# Patient Record
Sex: Female | Born: 1945 | Race: White | Hispanic: No | Smoking: Former smoker
Health system: Southern US, Community
[De-identification: ages and names within clinical notes are randomized; demographics above are authoritative.]

## PROBLEM LIST (undated history)

## (undated) DIAGNOSIS — K219 Gastro-esophageal reflux disease without esophagitis: Secondary | ICD-10-CM

## (undated) DIAGNOSIS — R319 Hematuria, unspecified: Secondary | ICD-10-CM

## (undated) DIAGNOSIS — I10 Essential (primary) hypertension: Secondary | ICD-10-CM

## (undated) DIAGNOSIS — M1990 Unspecified osteoarthritis, unspecified site: Secondary | ICD-10-CM

## (undated) DIAGNOSIS — Z87442 Personal history of urinary calculi: Secondary | ICD-10-CM

## (undated) DIAGNOSIS — C801 Malignant (primary) neoplasm, unspecified: Secondary | ICD-10-CM

## (undated) DIAGNOSIS — R002 Palpitations: Secondary | ICD-10-CM

## (undated) HISTORY — DX: Palpitations: R00.2

## (undated) HISTORY — PX: COLON RESECTION: SHX5231

## (undated) HISTORY — PX: TUBAL LIGATION: SHX77

## (undated) HISTORY — DX: Essential (primary) hypertension: I10

## (undated) HISTORY — DX: Hematuria, unspecified: R31.9

---

## 2001-01-20 ENCOUNTER — Encounter: Payer: Self-pay | Admitting: Orthopedic Surgery

## 2001-01-20 ENCOUNTER — Encounter: Payer: Self-pay | Admitting: Obstetrics and Gynecology

## 2001-01-20 ENCOUNTER — Encounter: Admission: RE | Admit: 2001-01-20 | Discharge: 2001-01-20 | Payer: Self-pay | Admitting: Orthopedic Surgery

## 2001-01-26 ENCOUNTER — Encounter: Admission: RE | Admit: 2001-01-26 | Discharge: 2001-01-26 | Payer: Self-pay | Admitting: Obstetrics and Gynecology

## 2001-01-26 ENCOUNTER — Encounter: Payer: Self-pay | Admitting: Obstetrics and Gynecology

## 2005-11-04 ENCOUNTER — Other Ambulatory Visit: Admission: RE | Admit: 2005-11-04 | Discharge: 2005-11-04 | Payer: Self-pay | Admitting: Obstetrics and Gynecology

## 2005-12-03 ENCOUNTER — Encounter: Admission: RE | Admit: 2005-12-03 | Discharge: 2005-12-03 | Payer: Self-pay | Admitting: Obstetrics and Gynecology

## 2015-04-13 DIAGNOSIS — Z23 Encounter for immunization: Secondary | ICD-10-CM | POA: Diagnosis not present

## 2015-04-13 DIAGNOSIS — E559 Vitamin D deficiency, unspecified: Secondary | ICD-10-CM | POA: Diagnosis not present

## 2015-04-13 DIAGNOSIS — M179 Osteoarthritis of knee, unspecified: Secondary | ICD-10-CM | POA: Diagnosis not present

## 2015-04-13 DIAGNOSIS — Z1211 Encounter for screening for malignant neoplasm of colon: Secondary | ICD-10-CM | POA: Diagnosis not present

## 2015-04-13 DIAGNOSIS — Z0000 Encounter for general adult medical examination without abnormal findings: Secondary | ICD-10-CM | POA: Diagnosis not present

## 2015-04-13 DIAGNOSIS — I10 Essential (primary) hypertension: Secondary | ICD-10-CM | POA: Diagnosis not present

## 2015-06-01 DIAGNOSIS — G4700 Insomnia, unspecified: Secondary | ICD-10-CM | POA: Diagnosis not present

## 2015-06-01 DIAGNOSIS — Z136 Encounter for screening for cardiovascular disorders: Secondary | ICD-10-CM | POA: Diagnosis not present

## 2015-06-01 DIAGNOSIS — E785 Hyperlipidemia, unspecified: Secondary | ICD-10-CM | POA: Diagnosis not present

## 2015-06-01 DIAGNOSIS — K219 Gastro-esophageal reflux disease without esophagitis: Secondary | ICD-10-CM | POA: Diagnosis not present

## 2015-06-01 DIAGNOSIS — I10 Essential (primary) hypertension: Secondary | ICD-10-CM | POA: Diagnosis not present

## 2015-06-01 DIAGNOSIS — E782 Mixed hyperlipidemia: Secondary | ICD-10-CM | POA: Diagnosis not present

## 2016-03-24 DIAGNOSIS — I10 Essential (primary) hypertension: Secondary | ICD-10-CM | POA: Diagnosis not present

## 2016-03-24 DIAGNOSIS — R197 Diarrhea, unspecified: Secondary | ICD-10-CM | POA: Diagnosis not present

## 2016-03-24 DIAGNOSIS — Z23 Encounter for immunization: Secondary | ICD-10-CM | POA: Diagnosis not present

## 2016-03-24 DIAGNOSIS — Z0000 Encounter for general adult medical examination without abnormal findings: Secondary | ICD-10-CM | POA: Diagnosis not present

## 2016-03-25 DIAGNOSIS — R197 Diarrhea, unspecified: Secondary | ICD-10-CM | POA: Diagnosis not present

## 2016-04-03 DIAGNOSIS — R197 Diarrhea, unspecified: Secondary | ICD-10-CM | POA: Diagnosis not present

## 2016-04-03 DIAGNOSIS — M179 Osteoarthritis of knee, unspecified: Secondary | ICD-10-CM | POA: Diagnosis not present

## 2016-04-03 DIAGNOSIS — R739 Hyperglycemia, unspecified: Secondary | ICD-10-CM | POA: Diagnosis not present

## 2016-05-26 DIAGNOSIS — Z23 Encounter for immunization: Secondary | ICD-10-CM | POA: Diagnosis not present

## 2016-05-26 DIAGNOSIS — R197 Diarrhea, unspecified: Secondary | ICD-10-CM | POA: Diagnosis not present

## 2016-05-26 DIAGNOSIS — I10 Essential (primary) hypertension: Secondary | ICD-10-CM | POA: Diagnosis not present

## 2016-05-29 DIAGNOSIS — R197 Diarrhea, unspecified: Secondary | ICD-10-CM | POA: Diagnosis not present

## 2016-06-06 DIAGNOSIS — Z23 Encounter for immunization: Secondary | ICD-10-CM | POA: Diagnosis not present

## 2016-06-06 DIAGNOSIS — R197 Diarrhea, unspecified: Secondary | ICD-10-CM | POA: Diagnosis not present

## 2016-06-06 DIAGNOSIS — I10 Essential (primary) hypertension: Secondary | ICD-10-CM | POA: Diagnosis not present

## 2016-06-06 DIAGNOSIS — Z0000 Encounter for general adult medical examination without abnormal findings: Secondary | ICD-10-CM | POA: Diagnosis not present

## 2016-06-06 DIAGNOSIS — Z01419 Encounter for gynecological examination (general) (routine) without abnormal findings: Secondary | ICD-10-CM | POA: Diagnosis not present

## 2016-06-06 DIAGNOSIS — Z1211 Encounter for screening for malignant neoplasm of colon: Secondary | ICD-10-CM | POA: Diagnosis not present

## 2016-06-06 DIAGNOSIS — Z6834 Body mass index (BMI) 34.0-34.9, adult: Secondary | ICD-10-CM | POA: Diagnosis not present

## 2016-06-20 DIAGNOSIS — R197 Diarrhea, unspecified: Secondary | ICD-10-CM | POA: Diagnosis not present

## 2016-06-20 DIAGNOSIS — Z6834 Body mass index (BMI) 34.0-34.9, adult: Secondary | ICD-10-CM | POA: Diagnosis not present

## 2016-07-25 DIAGNOSIS — R197 Diarrhea, unspecified: Secondary | ICD-10-CM | POA: Diagnosis not present

## 2016-10-06 DIAGNOSIS — K591 Functional diarrhea: Secondary | ICD-10-CM | POA: Diagnosis not present

## 2016-10-07 DIAGNOSIS — R197 Diarrhea, unspecified: Secondary | ICD-10-CM | POA: Diagnosis not present

## 2016-10-09 DIAGNOSIS — R197 Diarrhea, unspecified: Secondary | ICD-10-CM | POA: Diagnosis not present

## 2016-11-18 DIAGNOSIS — A079 Protozoal intestinal disease, unspecified: Secondary | ICD-10-CM | POA: Diagnosis not present

## 2016-12-05 DIAGNOSIS — A09 Infectious gastroenteritis and colitis, unspecified: Secondary | ICD-10-CM | POA: Diagnosis not present

## 2016-12-18 DIAGNOSIS — I10 Essential (primary) hypertension: Secondary | ICD-10-CM | POA: Diagnosis not present

## 2016-12-18 DIAGNOSIS — L820 Inflamed seborrheic keratosis: Secondary | ICD-10-CM | POA: Diagnosis not present

## 2016-12-18 DIAGNOSIS — R197 Diarrhea, unspecified: Secondary | ICD-10-CM | POA: Diagnosis not present

## 2016-12-29 DIAGNOSIS — Z1211 Encounter for screening for malignant neoplasm of colon: Secondary | ICD-10-CM | POA: Diagnosis not present

## 2016-12-29 DIAGNOSIS — K5790 Diverticulosis of intestine, part unspecified, without perforation or abscess without bleeding: Secondary | ICD-10-CM | POA: Diagnosis not present

## 2016-12-29 DIAGNOSIS — K5730 Diverticulosis of large intestine without perforation or abscess without bleeding: Secondary | ICD-10-CM | POA: Diagnosis not present

## 2016-12-29 DIAGNOSIS — D128 Benign neoplasm of rectum: Secondary | ICD-10-CM | POA: Diagnosis not present

## 2017-01-01 DIAGNOSIS — L820 Inflamed seborrheic keratosis: Secondary | ICD-10-CM | POA: Diagnosis not present

## 2017-01-01 DIAGNOSIS — L918 Other hypertrophic disorders of the skin: Secondary | ICD-10-CM | POA: Diagnosis not present

## 2017-01-01 DIAGNOSIS — F411 Generalized anxiety disorder: Secondary | ICD-10-CM | POA: Diagnosis not present

## 2017-01-01 DIAGNOSIS — C189 Malignant neoplasm of colon, unspecified: Secondary | ICD-10-CM | POA: Diagnosis not present

## 2017-01-01 DIAGNOSIS — Z6834 Body mass index (BMI) 34.0-34.9, adult: Secondary | ICD-10-CM | POA: Diagnosis not present

## 2017-01-02 DIAGNOSIS — D128 Benign neoplasm of rectum: Secondary | ICD-10-CM | POA: Diagnosis not present

## 2017-01-23 DIAGNOSIS — K621 Rectal polyp: Secondary | ICD-10-CM | POA: Diagnosis not present

## 2017-01-29 DIAGNOSIS — M179 Osteoarthritis of knee, unspecified: Secondary | ICD-10-CM | POA: Diagnosis not present

## 2017-01-29 DIAGNOSIS — Z6834 Body mass index (BMI) 34.0-34.9, adult: Secondary | ICD-10-CM | POA: Diagnosis not present

## 2017-01-29 DIAGNOSIS — I10 Essential (primary) hypertension: Secondary | ICD-10-CM | POA: Diagnosis not present

## 2017-01-29 DIAGNOSIS — C189 Malignant neoplasm of colon, unspecified: Secondary | ICD-10-CM | POA: Diagnosis not present

## 2017-03-11 DIAGNOSIS — I10 Essential (primary) hypertension: Secondary | ICD-10-CM | POA: Diagnosis not present

## 2017-03-11 DIAGNOSIS — K621 Rectal polyp: Secondary | ICD-10-CM | POA: Diagnosis not present

## 2017-03-11 DIAGNOSIS — Z01818 Encounter for other preprocedural examination: Secondary | ICD-10-CM | POA: Diagnosis not present

## 2017-03-11 DIAGNOSIS — K6282 Dysplasia of anus: Secondary | ICD-10-CM | POA: Diagnosis not present

## 2017-03-11 DIAGNOSIS — K629 Disease of anus and rectum, unspecified: Secondary | ICD-10-CM | POA: Diagnosis not present

## 2017-03-12 DIAGNOSIS — R9431 Abnormal electrocardiogram [ECG] [EKG]: Secondary | ICD-10-CM | POA: Diagnosis not present

## 2017-03-12 DIAGNOSIS — R000 Tachycardia, unspecified: Secondary | ICD-10-CM | POA: Diagnosis not present

## 2017-03-23 DIAGNOSIS — K6282 Dysplasia of anus: Secondary | ICD-10-CM | POA: Diagnosis not present

## 2017-03-23 DIAGNOSIS — D128 Benign neoplasm of rectum: Secondary | ICD-10-CM | POA: Diagnosis not present

## 2017-03-23 DIAGNOSIS — I10 Essential (primary) hypertension: Secondary | ICD-10-CM | POA: Diagnosis not present

## 2017-03-23 DIAGNOSIS — K621 Rectal polyp: Secondary | ICD-10-CM | POA: Diagnosis not present

## 2017-04-15 DIAGNOSIS — K621 Rectal polyp: Secondary | ICD-10-CM | POA: Diagnosis not present

## 2017-04-15 DIAGNOSIS — Z9889 Other specified postprocedural states: Secondary | ICD-10-CM | POA: Diagnosis not present

## 2017-04-15 DIAGNOSIS — Z48815 Encounter for surgical aftercare following surgery on the digestive system: Secondary | ICD-10-CM | POA: Diagnosis not present

## 2017-04-15 DIAGNOSIS — Z8719 Personal history of other diseases of the digestive system: Secondary | ICD-10-CM | POA: Diagnosis not present

## 2017-05-28 DIAGNOSIS — H04123 Dry eye syndrome of bilateral lacrimal glands: Secondary | ICD-10-CM | POA: Diagnosis not present

## 2017-05-28 DIAGNOSIS — H2513 Age-related nuclear cataract, bilateral: Secondary | ICD-10-CM | POA: Diagnosis not present

## 2017-06-11 ENCOUNTER — Other Ambulatory Visit: Payer: Self-pay | Admitting: Family Medicine

## 2017-06-11 ENCOUNTER — Ambulatory Visit
Admission: RE | Admit: 2017-06-11 | Discharge: 2017-06-11 | Disposition: A | Discharge status: Home / Self Care | Payer: Medicare Other | Source: Ambulatory Visit | Attending: Family Medicine | Admitting: Family Medicine

## 2017-06-11 DIAGNOSIS — Z23 Encounter for immunization: Secondary | ICD-10-CM | POA: Diagnosis not present

## 2017-06-11 DIAGNOSIS — C189 Malignant neoplasm of colon, unspecified: Secondary | ICD-10-CM | POA: Diagnosis not present

## 2017-06-11 DIAGNOSIS — K219 Gastro-esophageal reflux disease without esophagitis: Secondary | ICD-10-CM | POA: Diagnosis not present

## 2017-06-11 DIAGNOSIS — I10 Essential (primary) hypertension: Secondary | ICD-10-CM | POA: Diagnosis not present

## 2017-06-11 DIAGNOSIS — M170 Bilateral primary osteoarthritis of knee: Secondary | ICD-10-CM

## 2017-06-11 DIAGNOSIS — Z1231 Encounter for screening mammogram for malignant neoplasm of breast: Secondary | ICD-10-CM

## 2017-06-11 DIAGNOSIS — Z0000 Encounter for general adult medical examination without abnormal findings: Secondary | ICD-10-CM | POA: Diagnosis not present

## 2017-06-11 DIAGNOSIS — Z6834 Body mass index (BMI) 34.0-34.9, adult: Secondary | ICD-10-CM | POA: Diagnosis not present

## 2017-06-11 DIAGNOSIS — M179 Osteoarthritis of knee, unspecified: Secondary | ICD-10-CM | POA: Diagnosis not present

## 2017-06-11 DIAGNOSIS — Z79899 Other long term (current) drug therapy: Secondary | ICD-10-CM | POA: Diagnosis not present

## 2017-06-29 ENCOUNTER — Ambulatory Visit
Admission: RE | Admit: 2017-06-29 | Discharge: 2017-06-29 | Disposition: A | Discharge status: Home / Self Care | Payer: Medicare Other | Source: Ambulatory Visit | Attending: Family Medicine | Admitting: Family Medicine

## 2017-06-29 DIAGNOSIS — Z1231 Encounter for screening mammogram for malignant neoplasm of breast: Secondary | ICD-10-CM | POA: Diagnosis not present

## 2017-06-30 ENCOUNTER — Ambulatory Visit: Payer: Self-pay

## 2017-07-23 DIAGNOSIS — Z6835 Body mass index (BMI) 35.0-35.9, adult: Secondary | ICD-10-CM | POA: Diagnosis not present

## 2017-07-23 DIAGNOSIS — M170 Bilateral primary osteoarthritis of knee: Secondary | ICD-10-CM | POA: Diagnosis not present

## 2017-07-23 DIAGNOSIS — K219 Gastro-esophageal reflux disease without esophagitis: Secondary | ICD-10-CM | POA: Diagnosis not present

## 2017-07-23 DIAGNOSIS — E782 Mixed hyperlipidemia: Secondary | ICD-10-CM | POA: Diagnosis not present

## 2017-07-23 DIAGNOSIS — G4700 Insomnia, unspecified: Secondary | ICD-10-CM | POA: Diagnosis not present

## 2017-07-30 DIAGNOSIS — M170 Bilateral primary osteoarthritis of knee: Secondary | ICD-10-CM | POA: Diagnosis not present

## 2017-08-07 DIAGNOSIS — M170 Bilateral primary osteoarthritis of knee: Secondary | ICD-10-CM | POA: Diagnosis not present

## 2017-08-13 DIAGNOSIS — M170 Bilateral primary osteoarthritis of knee: Secondary | ICD-10-CM | POA: Diagnosis not present

## 2017-08-20 DIAGNOSIS — M170 Bilateral primary osteoarthritis of knee: Secondary | ICD-10-CM | POA: Diagnosis not present

## 2017-08-24 DIAGNOSIS — M170 Bilateral primary osteoarthritis of knee: Secondary | ICD-10-CM | POA: Diagnosis not present

## 2017-10-08 DIAGNOSIS — M19019 Primary osteoarthritis, unspecified shoulder: Secondary | ICD-10-CM | POA: Diagnosis not present

## 2017-10-08 DIAGNOSIS — R05 Cough: Secondary | ICD-10-CM | POA: Diagnosis not present

## 2017-10-08 DIAGNOSIS — Z6835 Body mass index (BMI) 35.0-35.9, adult: Secondary | ICD-10-CM | POA: Diagnosis not present

## 2017-10-08 DIAGNOSIS — M170 Bilateral primary osteoarthritis of knee: Secondary | ICD-10-CM | POA: Diagnosis not present

## 2017-11-13 DIAGNOSIS — C20 Malignant neoplasm of rectum: Secondary | ICD-10-CM | POA: Insufficient documentation

## 2018-01-29 DIAGNOSIS — M170 Bilateral primary osteoarthritis of knee: Secondary | ICD-10-CM | POA: Diagnosis not present

## 2018-02-03 DIAGNOSIS — M170 Bilateral primary osteoarthritis of knee: Secondary | ICD-10-CM | POA: Diagnosis not present

## 2018-02-26 DIAGNOSIS — M170 Bilateral primary osteoarthritis of knee: Secondary | ICD-10-CM | POA: Diagnosis not present

## 2018-03-05 DIAGNOSIS — M170 Bilateral primary osteoarthritis of knee: Secondary | ICD-10-CM | POA: Diagnosis not present

## 2018-03-15 ENCOUNTER — Telehealth: Payer: Self-pay | Admitting: Oncology

## 2018-03-15 ENCOUNTER — Encounter: Payer: Self-pay | Admitting: Oncology

## 2018-03-15 DIAGNOSIS — C20 Malignant neoplasm of rectum: Secondary | ICD-10-CM | POA: Diagnosis not present

## 2018-03-15 NOTE — Telephone Encounter (Signed)
New transfer of care from [facility] for rectal cancer. Pt has been scheduled to see Dr. Benay Spice on 8/1 at 2pm. Pt has agreed to the appt date and time. Letter mailed.

## 2018-03-15 NOTE — Progress Notes (Signed)
Patient called to request appointment with Dr. Benay Spice. Patient had surgery on 4/19 at [facility] for rectal cancer. Patient would like to transfer care to [facility] because it is closer to her home. Patient scheduled to see Dr. Benay Spice on 03/25/18 @ 2 PM. Patient aware to arrive 30 minutes early.

## 2018-03-25 ENCOUNTER — Telehealth: Payer: Self-pay

## 2018-03-25 ENCOUNTER — Encounter: Payer: Self-pay | Admitting: Oncology

## 2018-03-25 ENCOUNTER — Inpatient Hospital Stay: Payer: Medicare PPO | Attending: Oncology | Admitting: Oncology

## 2018-03-25 VITALS — BP 140/95 | HR 105 | Temp 97.6°F | Resp 18 | Ht 60.05 in | Wt 176.4 lb

## 2018-03-25 DIAGNOSIS — E049 Nontoxic goiter, unspecified: Secondary | ICD-10-CM

## 2018-03-25 DIAGNOSIS — Z8049 Family history of malignant neoplasm of other genital organs: Secondary | ICD-10-CM | POA: Diagnosis not present

## 2018-03-25 DIAGNOSIS — C20 Malignant neoplasm of rectum: Secondary | ICD-10-CM

## 2018-03-25 DIAGNOSIS — Z8051 Family history of malignant neoplasm of kidney: Secondary | ICD-10-CM

## 2018-03-25 DIAGNOSIS — Z87891 Personal history of nicotine dependence: Secondary | ICD-10-CM

## 2018-03-25 DIAGNOSIS — I10 Essential (primary) hypertension: Secondary | ICD-10-CM | POA: Diagnosis not present

## 2018-03-25 NOTE — Progress Notes (Signed)
[facility] Patient Consult   Requesting MD: Fanny Bien, Md [address] [place], [place] [address]   SAMA ARAUZ 72 y.o.  [birth year]    Reason for consult: Colorectal cancer   HPI: Ms. Schooley reports initially developing "diarrhea "approximately 2 years ago.  She was referred to Dr. Earlean Shawl and underwent a colonoscopy 12/29/2016.  A malignant appearing polyp was partially resected at 15 cm.  The pathology revealed high-grade dysplasia and a tubulovillous adenoma.  She was referred to Dr. Drue Flirt and colorectal surgery at [facility].  She was taken to the operating room for a laparoscopic assisted polypectomy on 03/23/2017.  A snare polypectomy was performed at the polyp site and biopsies of the base were obtained.  The site was tattooed.  The pathology revealed superficial fragments of a tubular adenoma with high-grade dysplasia.  The biopsy from the rectal polyp bed revealed dysplastic glandular epithelium involving fibrotic stroma.  Close observation was elected as opposed to proceeding with a rectal resection. She reports recurrence of diarrhea in early 2019.  She saw Dr. Earlean Shawl and a sigmoidoscopy revealed adenocarcinoma at the previous polypectomy site (we do not have the sigmoidoscopy report available today). She was referred back to Dr. Drue Flirt and underwent a laparoscopic anterior resection of the rectum on 12/10/2017.  Tattoo was noted above the peritoneal reflection with a small mucosal-based lesion at that level.  No evidence of metastatic disease.  The pathology revealed a moderately differentiated adenocarcinoma measuring 1.2 cm.  Carcinoma focally extended beyond the muscularis propria.  23 lymph nodes were negative.  The resection margins were negative.  No lymphovascular or perineural invasion.  No tumor deposits.  No loss of mismatch repair protein expression.  She has recovered from surgery.  She saw Dr. Shary Decamp on 03/15/2018 to  discuss adjuvant treatment options.  She decided to transfer her medical oncology care to [facility].  A preoperative MRI of the pelvis 12/04/2017 revealed the tumor to be 12 cm from the anal verge.  The tumor was noted to be entirely above the peritoneal reflection.  The tumor was staged as a T2 versus early T3 lesion no suspicious lymph nodes.  The tumor was staged as a T2 (equivocal T3), N0 high rectal mass above the peritoneal reflection.  CTs of the chest, abdomen, and pelvis on 12/04/2017 revealed no evidence of metastatic disease.  A small subpleural node and scattered micronodular subpleural lesions were noted.  A left thyroid goiter was noted.   Past Medical History:  Diagnosis Date  . Hematuria    (W/u  neg 2011)  . Hypertension   . Palpitation     .  G4, P3, 1 miscarriage   .  GERD  Past Surgical History:  Procedure Laterality Date  . TUBAL LIGATION      Medications: Reviewed  Allergies:  Allergies  Allergen Reactions  . Lisinopril     cough    Family history: Her half sister had endometrial cancer.  Her half brother had renal cell cancer.  No other family history of cancer.  Social History:   She lives alone in [place].  She is retired from the [employer].  She quit smoking cigarettes 6 years ago.  No alcohol use.  No transfusion history.  No risk factor for HIV or hepatitis.  ROS:   Positives include: Reflux symptoms, bilateral knee arthritis  A complete ROS was otherwise negative.  Physical Exam:  Blood pressure (!) 140/95, pulse Marland Kitchen)  105, temperature 97.6 F (36.4 C), temperature source Oral, resp. rate 18, weight 176 lb 6.4 oz (80 kg), SpO2 100 %.  HEENT: Oropharynx without visible mass, without mass Lungs: End inspiratory rales at the right upper posterior chest, no respiratory distress Cardiac: Regular rate and rhythm Abdomen: No hepatosplenomegaly, no mass, nontender  Vascular: No leg edema Lymph nodes: No cervical, supraclavicular, axillary,  or inguinal nodes Neurologic: Alert and oriented, the motor exam appears intact in the upper and lower extremities Skin: No rash Musculoskeletal: Spine tenderness   LAB:  CBC  No results found for: WBC, HGB, HCT, MCV, PLT, NEUTROABS      CMP  No results found for: NA, K, CL, CO2, GLUCOSE, BUN, CREATININE, CALCIUM, PROT, ALBUMIN, AST, ALT, ALKPHOS, BILITOT, GFRNONAA, GFRAA   CEA on 01/07/2018: 0.7  Imaging:  As per HPI, CT and MRI images not available for review today   Assessment/Plan:   1. 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 on colonoscopy 12/30/2016  Laparoscopic assisted polypectomy 03/24/2017- superficial fragments of a tubular adenoma with high-grade dysplasia, biopsy of the polyp base-dysplastic glandular epithelium involving fibrotic stroma  Recurrent diarrhea 2019  Sigmoidoscopy-invasive moderately differentiated adenocarcinoma at the previous polyp site  CTs chest, abdomen, pelvis on 12/04/2017-no evidence of metastatic disease, left thyroid goiter, small subpleural node  MRI pelvis 12/04/2017, T2 (equivocal T3), N0 high rectal mass above the peritoneal reflection, measured at 12 cm from the anal verge  Laparoscopic anterior resection 12/10/2017, stage II (pT3,pN0 close (moderately differentiated adenocarcinoma of the rectum, early T3, no lymphovascular or perineural invasion, no loss of mismatch repair protein expression 2. Hypertension  3.   History of tobacco use  4.   Left thyroid "goiter "noted on CT 12/04/2017   Disposition:   Ms. Zylka was diagnosed with high rectal cancer when she underwent a laparoscopic anterior resection 12/10/2017.  The tumor is located in the high rectum, above the peritoneal reflection.  The early T3 tumor has no associated high risk features.  Ms. Essex has a good prognosis for a long-term disease-free survival.  She is now greater than 3 months out from surgery.  I do not recommend adjuvant  systemic chemotherapy.  I doubt radiation will be recommended, but I will present her case at the GI tumor conference to discuss the indication for adjuvant radiation.  She does not appear to have hereditary non-polyposis colon cancer syndrome, but her family members are increased risk of developing colorectal cancer and should receive appropriate screening.  She will return for an office visit and CEA in approximately 3 months.  She has a history of tobacco use.  She will be referred for a screening chest CT in 2020.  Ms. Tibbetts should follow-up with Dr. Earlean Shawl for surveillance colonoscopies.    Betsy Coder, MD  03/25/2018, 2:12 PM

## 2018-03-25 NOTE — Telephone Encounter (Signed)
Printed avs and calender of upcoming appointment. Per 8/1 los 

## 2018-04-09 ENCOUNTER — Telehealth: Payer: Self-pay

## 2018-04-09 NOTE — Telephone Encounter (Signed)
Received call from pt stating she is returning a call. No record of outgoing call documented. Pt informed of next appts. Pt will see if voicemail was left. This RN voiced understanding

## 2018-04-30 DIAGNOSIS — C20 Malignant neoplasm of rectum: Secondary | ICD-10-CM | POA: Diagnosis not present

## 2018-05-31 DIAGNOSIS — R197 Diarrhea, unspecified: Secondary | ICD-10-CM | POA: Diagnosis not present

## 2018-06-10 DIAGNOSIS — A09 Infectious gastroenteritis and colitis, unspecified: Secondary | ICD-10-CM | POA: Diagnosis not present

## 2018-06-14 DIAGNOSIS — A09 Infectious gastroenteritis and colitis, unspecified: Secondary | ICD-10-CM | POA: Diagnosis not present

## 2018-06-14 DIAGNOSIS — Z85048 Personal history of other malignant neoplasm of rectum, rectosigmoid junction, and anus: Secondary | ICD-10-CM | POA: Diagnosis not present

## 2018-06-18 DIAGNOSIS — Z6834 Body mass index (BMI) 34.0-34.9, adult: Secondary | ICD-10-CM | POA: Diagnosis not present

## 2018-06-18 DIAGNOSIS — Z0000 Encounter for general adult medical examination without abnormal findings: Secondary | ICD-10-CM | POA: Diagnosis not present

## 2018-06-18 DIAGNOSIS — M170 Bilateral primary osteoarthritis of knee: Secondary | ICD-10-CM | POA: Diagnosis not present

## 2018-06-18 DIAGNOSIS — F411 Generalized anxiety disorder: Secondary | ICD-10-CM | POA: Diagnosis not present

## 2018-06-18 DIAGNOSIS — Z23 Encounter for immunization: Secondary | ICD-10-CM | POA: Diagnosis not present

## 2018-06-18 DIAGNOSIS — C189 Malignant neoplasm of colon, unspecified: Secondary | ICD-10-CM | POA: Diagnosis not present

## 2018-06-25 ENCOUNTER — Inpatient Hospital Stay (HOSPITAL_BASED_OUTPATIENT_CLINIC_OR_DEPARTMENT_OTHER): Payer: Medicare PPO | Admitting: Oncology

## 2018-06-25 ENCOUNTER — Inpatient Hospital Stay: Payer: Medicare PPO | Attending: Oncology

## 2018-06-25 ENCOUNTER — Telehealth: Payer: Self-pay | Admitting: *Deleted

## 2018-06-25 ENCOUNTER — Telehealth: Payer: Self-pay | Admitting: Oncology

## 2018-06-25 VITALS — BP 150/90 | HR 96 | Temp 97.5°F | Resp 20 | Ht 60.05 in | Wt 178.2 lb

## 2018-06-25 DIAGNOSIS — C20 Malignant neoplasm of rectum: Secondary | ICD-10-CM

## 2018-06-25 DIAGNOSIS — I10 Essential (primary) hypertension: Secondary | ICD-10-CM | POA: Diagnosis not present

## 2018-06-25 DIAGNOSIS — C19 Malignant neoplasm of rectosigmoid junction: Secondary | ICD-10-CM | POA: Diagnosis not present

## 2018-06-25 DIAGNOSIS — Z87891 Personal history of nicotine dependence: Secondary | ICD-10-CM | POA: Insufficient documentation

## 2018-06-25 LAB — CEA (IN HOUSE-CHCC): CEA (CHCC-In House): 1 ng/mL (ref 0.00–5.00)

## 2018-06-25 NOTE — Telephone Encounter (Signed)
Patient notified of norma CEA.

## 2018-06-25 NOTE — Telephone Encounter (Signed)
-----   Message from Ladell Pier, MD sent at 06/25/2018  2:42 PM EDT ----- Please call patient, cea is normal

## 2018-06-25 NOTE — Telephone Encounter (Signed)
Appts scheduled avs/calendar printed per 11/1 los °

## 2018-06-25 NOTE — Progress Notes (Addendum)
  [place] OFFICE PROGRESS NOTE   Diagnosis: Colorectal cancer  INTERVAL HISTORY:   Ms. Stillion returns as scheduled.  She has irregular bowel habits.  She reports being treated for "E. coli "Dr. Earlean Shawl.  She has loose stool approximately every 3 days. She has an upper respiratory infection at present.  She reports sinus drainage and a cough for the past month.  No fever or dyspnea. Good appetite.  Objective:  Vital signs in last 24 hours:  Blood pressure (!) 150/90, pulse 96, temperature (!) 97.5 F (36.4 C), temperature source Oral, resp. rate 20, height 5' 0.05" (1.525 m), weight 178 lb 3.2 oz (80.8 kg), SpO2 99 %.    HEENT: Neck without mass Lymphatics: No cervical, supraclavicular, axillary, or inguinal nodes Resp: Scattered bilateral inspiratory/expiratory wheeze and rhonchi at the upper anterior and posterior chest, no respiratory distress Cardio: Regular rate and rhythm GI: Nontender, no hepatomegaly, no mass Vascular: No leg edema     Medications: I have reviewed the patient's current medications.   Assessment/Plan: 1. Rectal cancer ? 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 Laparoscopic assisted polypectomy 03/24/2017- superficial fragments of a tubular adenoma with high-grade dysplasia, biopsy of the polyp base-dysplastic glandular epithelium involving fibrotic stroma ? Recurrent diarrhea 2019 ? Sigmoidoscopy-invasive moderately differentiated adenocarcinoma at the previous polyp site ? CTs chest, abdomen, pelvis on 12/04/2017-no evidence of metastatic disease, left thyroid goiter, small subpleural node ? MRI pelvis 12/04/2017, T2 (equivocal T3), N0 high rectal mass above the peritoneal reflection, measured at 12 cm from the anal verge ? Laparoscopic anterior resection 12/10/2017, stage II (pT3,pN0 close (moderately differentiated adenocarcinoma of the rectum, early T3, no lymphovascular or perineural invasion,  no loss of mismatch repair protein expression ? CEA on 01/07/2018 - 0.7 2. Hypertension  3.   History of tobacco use  4.   Left thyroid "goiter "noted on CT 12/04/2017   Disposition: Ms. Grassel is in clinical remission from colon cancer.  We will follow-up on the CEA from today.  She will return for an office visit and CEA in 6 months. She will continue follow-up with Dr. Earlean Shawl for management of the symptoms stool infection and irregular bowel habits.  She will see Dr. Ernie Hew if the upper respiratory symptoms do not improve.  15 minutes were spent with the patient today.  The majority of the time was used for counseling and coordination of care.  Betsy Coder, MD  06/25/2018  11:31 AM

## 2018-07-05 DIAGNOSIS — R05 Cough: Secondary | ICD-10-CM | POA: Diagnosis not present

## 2018-07-05 DIAGNOSIS — Z6834 Body mass index (BMI) 34.0-34.9, adult: Secondary | ICD-10-CM | POA: Diagnosis not present

## 2018-07-05 DIAGNOSIS — J309 Allergic rhinitis, unspecified: Secondary | ICD-10-CM | POA: Diagnosis not present

## 2018-07-30 DIAGNOSIS — J209 Acute bronchitis, unspecified: Secondary | ICD-10-CM | POA: Diagnosis not present

## 2018-07-30 DIAGNOSIS — Z6834 Body mass index (BMI) 34.0-34.9, adult: Secondary | ICD-10-CM | POA: Diagnosis not present

## 2018-07-30 DIAGNOSIS — J309 Allergic rhinitis, unspecified: Secondary | ICD-10-CM | POA: Diagnosis not present

## 2018-07-30 DIAGNOSIS — M170 Bilateral primary osteoarthritis of knee: Secondary | ICD-10-CM | POA: Diagnosis not present

## 2018-07-30 DIAGNOSIS — G4700 Insomnia, unspecified: Secondary | ICD-10-CM | POA: Diagnosis not present

## 2018-07-30 DIAGNOSIS — F411 Generalized anxiety disorder: Secondary | ICD-10-CM | POA: Diagnosis not present

## 2018-08-30 DIAGNOSIS — J209 Acute bronchitis, unspecified: Secondary | ICD-10-CM | POA: Diagnosis not present

## 2018-08-30 DIAGNOSIS — C189 Malignant neoplasm of colon, unspecified: Secondary | ICD-10-CM | POA: Diagnosis not present

## 2018-08-30 DIAGNOSIS — M179 Osteoarthritis of knee, unspecified: Secondary | ICD-10-CM | POA: Diagnosis not present

## 2018-08-30 DIAGNOSIS — E782 Mixed hyperlipidemia: Secondary | ICD-10-CM | POA: Diagnosis not present

## 2018-08-30 DIAGNOSIS — I10 Essential (primary) hypertension: Secondary | ICD-10-CM | POA: Diagnosis not present

## 2018-08-30 DIAGNOSIS — Z6835 Body mass index (BMI) 35.0-35.9, adult: Secondary | ICD-10-CM | POA: Diagnosis not present

## 2018-09-20 ENCOUNTER — Telehealth: Payer: Self-pay | Admitting: *Deleted

## 2018-09-20 NOTE — Telephone Encounter (Signed)
Patient called to inquire how the low dose CT scan screening was billed. Was under impression that [facility] had a "program" for this and it included financial assistance. She called insurance company and was told her copay would be about $275 and this is a strain on her financially. She would also like Dr. Ernie Hew to get a copy of the scan report. Informed her it will be in Epic for Dr. Ernie Hew to review. Informed her I will ask the thoracic navigator if there is any financial assistance. Will also send message to managed care to get scan authorized.

## 2018-09-24 ENCOUNTER — Encounter: Payer: Self-pay | Admitting: *Deleted

## 2018-09-24 ENCOUNTER — Ambulatory Visit (HOSPITAL_COMMUNITY)
Admission: RE | Admit: 2018-09-24 | Discharge: 2018-09-24 | Disposition: A | Discharge status: Home / Self Care | Payer: Medicare PPO | Source: Ambulatory Visit | Attending: Oncology | Admitting: Oncology

## 2018-09-24 ENCOUNTER — Encounter (HOSPITAL_COMMUNITY): Payer: Self-pay

## 2018-09-24 DIAGNOSIS — C20 Malignant neoplasm of rectum: Secondary | ICD-10-CM

## 2018-09-24 NOTE — Progress Notes (Signed)
Radiology request to change low dose CT chest screening to CT chest without contrast. They are not able to do low dose screening scan due to patient having active bronchitis. Per Dr. Shonna Chock, reschedule the screening CT when her bronchitis is resolved. If her PCP needs a CT scan, then the PCP needs to order this. Also informed radiology that patient was concerned about the cost of the screening scan and if cost of normal CT chest is more, they should inform her.

## 2018-10-04 DIAGNOSIS — R05 Cough: Secondary | ICD-10-CM | POA: Diagnosis not present

## 2018-10-04 DIAGNOSIS — J209 Acute bronchitis, unspecified: Secondary | ICD-10-CM | POA: Diagnosis not present

## 2018-10-04 DIAGNOSIS — J309 Allergic rhinitis, unspecified: Secondary | ICD-10-CM | POA: Diagnosis not present

## 2018-10-04 DIAGNOSIS — Z6835 Body mass index (BMI) 35.0-35.9, adult: Secondary | ICD-10-CM | POA: Diagnosis not present

## 2018-10-04 DIAGNOSIS — Z87891 Personal history of nicotine dependence: Secondary | ICD-10-CM | POA: Diagnosis not present

## 2018-10-08 DIAGNOSIS — K219 Gastro-esophageal reflux disease without esophagitis: Secondary | ICD-10-CM | POA: Diagnosis not present

## 2018-10-08 DIAGNOSIS — M170 Bilateral primary osteoarthritis of knee: Secondary | ICD-10-CM | POA: Diagnosis not present

## 2018-10-08 DIAGNOSIS — J309 Allergic rhinitis, unspecified: Secondary | ICD-10-CM | POA: Diagnosis not present

## 2018-10-08 DIAGNOSIS — I10 Essential (primary) hypertension: Secondary | ICD-10-CM | POA: Diagnosis not present

## 2018-10-21 DIAGNOSIS — M170 Bilateral primary osteoarthritis of knee: Secondary | ICD-10-CM | POA: Diagnosis not present

## 2018-10-28 DIAGNOSIS — M170 Bilateral primary osteoarthritis of knee: Secondary | ICD-10-CM | POA: Diagnosis not present

## 2018-11-05 DIAGNOSIS — M170 Bilateral primary osteoarthritis of knee: Secondary | ICD-10-CM | POA: Diagnosis not present

## 2018-12-23 ENCOUNTER — Telehealth: Payer: Self-pay | Admitting: Oncology

## 2018-12-23 NOTE — Telephone Encounter (Signed)
Scheduled 10mth f/u per sch msg. Mailed printout

## 2018-12-24 ENCOUNTER — Ambulatory Visit: Payer: Medicare PPO | Admitting: Oncology

## 2018-12-24 ENCOUNTER — Other Ambulatory Visit: Payer: Medicare PPO

## 2019-01-07 DIAGNOSIS — C189 Malignant neoplasm of colon, unspecified: Secondary | ICD-10-CM | POA: Diagnosis not present

## 2019-01-07 DIAGNOSIS — K219 Gastro-esophageal reflux disease without esophagitis: Secondary | ICD-10-CM | POA: Diagnosis not present

## 2019-01-07 DIAGNOSIS — I10 Essential (primary) hypertension: Secondary | ICD-10-CM | POA: Diagnosis not present

## 2019-01-07 DIAGNOSIS — J309 Allergic rhinitis, unspecified: Secondary | ICD-10-CM | POA: Diagnosis not present

## 2019-01-07 DIAGNOSIS — M19019 Primary osteoarthritis, unspecified shoulder: Secondary | ICD-10-CM | POA: Diagnosis not present

## 2019-01-10 DIAGNOSIS — C189 Malignant neoplasm of colon, unspecified: Secondary | ICD-10-CM | POA: Diagnosis not present

## 2019-01-20 ENCOUNTER — Telehealth: Payer: Self-pay | Admitting: Hematology and Oncology

## 2019-01-20 NOTE — Telephone Encounter (Signed)
Called regarding reschedule

## 2019-01-25 ENCOUNTER — Ambulatory Visit: Payer: Medicare PPO | Admitting: Oncology

## 2019-01-25 ENCOUNTER — Other Ambulatory Visit: Payer: Medicare PPO

## 2019-02-17 DIAGNOSIS — I10 Essential (primary) hypertension: Secondary | ICD-10-CM | POA: Diagnosis not present

## 2019-02-17 DIAGNOSIS — C189 Malignant neoplasm of colon, unspecified: Secondary | ICD-10-CM | POA: Diagnosis not present

## 2019-02-17 DIAGNOSIS — M179 Osteoarthritis of knee, unspecified: Secondary | ICD-10-CM | POA: Diagnosis not present

## 2019-02-21 DIAGNOSIS — Z980 Intestinal bypass and anastomosis status: Secondary | ICD-10-CM | POA: Diagnosis not present

## 2019-02-21 DIAGNOSIS — Z8719 Personal history of other diseases of the digestive system: Secondary | ICD-10-CM | POA: Diagnosis not present

## 2019-02-21 DIAGNOSIS — Z1211 Encounter for screening for malignant neoplasm of colon: Secondary | ICD-10-CM | POA: Diagnosis not present

## 2019-02-21 DIAGNOSIS — Z933 Colostomy status: Secondary | ICD-10-CM | POA: Diagnosis not present

## 2019-02-21 DIAGNOSIS — Z85048 Personal history of other malignant neoplasm of rectum, rectosigmoid junction, and anus: Secondary | ICD-10-CM | POA: Diagnosis not present

## 2019-02-21 DIAGNOSIS — Z9049 Acquired absence of other specified parts of digestive tract: Secondary | ICD-10-CM | POA: Diagnosis not present

## 2019-03-10 ENCOUNTER — Ambulatory Visit: Payer: Medicare PPO | Admitting: Oncology

## 2019-03-10 ENCOUNTER — Other Ambulatory Visit: Payer: Medicare PPO

## 2019-04-07 DIAGNOSIS — E559 Vitamin D deficiency, unspecified: Secondary | ICD-10-CM | POA: Diagnosis not present

## 2019-04-07 DIAGNOSIS — I10 Essential (primary) hypertension: Secondary | ICD-10-CM | POA: Diagnosis not present

## 2019-04-07 DIAGNOSIS — E782 Mixed hyperlipidemia: Secondary | ICD-10-CM | POA: Diagnosis not present

## 2019-04-14 DIAGNOSIS — I10 Essential (primary) hypertension: Secondary | ICD-10-CM | POA: Diagnosis not present

## 2019-04-14 DIAGNOSIS — G4700 Insomnia, unspecified: Secondary | ICD-10-CM | POA: Diagnosis not present

## 2019-04-14 DIAGNOSIS — E782 Mixed hyperlipidemia: Secondary | ICD-10-CM | POA: Diagnosis not present

## 2019-04-14 DIAGNOSIS — E559 Vitamin D deficiency, unspecified: Secondary | ICD-10-CM | POA: Diagnosis not present

## 2019-04-22 ENCOUNTER — Other Ambulatory Visit: Payer: Self-pay

## 2019-04-22 ENCOUNTER — Inpatient Hospital Stay (HOSPITAL_BASED_OUTPATIENT_CLINIC_OR_DEPARTMENT_OTHER): Payer: Medicare PPO | Admitting: Oncology

## 2019-04-22 ENCOUNTER — Telehealth: Payer: Self-pay | Admitting: Oncology

## 2019-04-22 ENCOUNTER — Inpatient Hospital Stay: Payer: Medicare PPO | Attending: Oncology

## 2019-04-22 VITALS — BP 123/65 | HR 92 | Temp 98.5°F | Resp 18 | Ht 60.05 in | Wt 179.2 lb

## 2019-04-22 DIAGNOSIS — C20 Malignant neoplasm of rectum: Secondary | ICD-10-CM

## 2019-04-22 DIAGNOSIS — Z87891 Personal history of nicotine dependence: Secondary | ICD-10-CM | POA: Insufficient documentation

## 2019-04-22 DIAGNOSIS — I10 Essential (primary) hypertension: Secondary | ICD-10-CM | POA: Insufficient documentation

## 2019-04-22 DIAGNOSIS — Z85048 Personal history of other malignant neoplasm of rectum, rectosigmoid junction, and anus: Secondary | ICD-10-CM | POA: Diagnosis not present

## 2019-04-22 DIAGNOSIS — E049 Nontoxic goiter, unspecified: Secondary | ICD-10-CM | POA: Diagnosis not present

## 2019-04-22 NOTE — Progress Notes (Addendum)
  [facility] OFFICE PROGRESS NOTE   Diagnosis: Rectal cancer  INTERVAL HISTORY:   Amber Morse returns as scheduled.  She feels well.  Good appetite.  She reports insomnia.  No difficulty with bowel function.  She reports undergoing a surveillance colonoscopy this year by Dr. Earlean Shawl.  Objective:  Vital signs in last 24 hours:  Blood pressure 123/65, pulse 92, temperature 98.5 F (36.9 C), temperature source Oral, resp. rate 18, height 5' 0.05" (1.525 m), weight 179 lb 3.2 oz (81.3 kg), SpO2 100 %.   Limited physical examination secondary to distancing with the COVID pandemic HEENT: Neck without mass Lymphatics: No cervical, supraclavicular, axillary, or inguinal nodes GI: No hepatosplenomegaly, nontender, no mass Vascular: No leg edema   Medications: I have reviewed the patient's current medications.   Assessment/Plan: sessment/Plan: 1. Rectal cancer ? 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 Laparoscopic assisted polypectomy 03/24/2017- superficial fragments of a tubular adenoma with high-grade dysplasia, biopsy of the polyp base-dysplastic glandular epithelium involving fibrotic stroma ? Recurrent diarrhea 2019 ? Sigmoidoscopy-invasive moderately differentiated adenocarcinoma at the previous polyp site ? CTs chest, abdomen, pelvis on 12/04/2017-no evidence of metastatic disease, left thyroid goiter, small subpleural node ? MRI pelvis 12/04/2017, T2 (equivocal T3), N0 high rectal mass above the peritoneal reflection, measured at 12 cm from the anal verge ? Laparoscopic anterior resection 12/10/2017, stage II (pT3,pN0 close (moderately differentiated adenocarcinoma of the rectum, early T3, no lymphovascular or perineural invasion, no loss of mismatch repair protein expression ? CEA on 01/07/2018 - 0.7 2. Hypertension  3.   History of tobacco use  4.   Left thyroid "goiter "noted on CT 12/04/2017     Disposition:  Amber Morse is in remission from rectal cancer.  We will follow-up on the CEA obtained by Dr. Ernie Hew in May.  She will return for an office visit and CEA in approximately 4 months.  She continues colonoscopy surveillance with Dr. Earlean Shawl. 15 minutes were spent with the patient today.  The majority of the time was used for counseling and coordination of care.   Julieanne Manson, MD  Addendum: CEA from [facility] on 01/11/2019: 1.8

## 2019-04-22 NOTE — Telephone Encounter (Signed)
Scheduled per 08/28 los, patient received avs and calender.

## 2019-05-19 DIAGNOSIS — Z23 Encounter for immunization: Secondary | ICD-10-CM | POA: Diagnosis not present

## 2019-05-19 DIAGNOSIS — M170 Bilateral primary osteoarthritis of knee: Secondary | ICD-10-CM | POA: Diagnosis not present

## 2019-05-30 DIAGNOSIS — R197 Diarrhea, unspecified: Secondary | ICD-10-CM | POA: Diagnosis not present

## 2019-05-31 DIAGNOSIS — M170 Bilateral primary osteoarthritis of knee: Secondary | ICD-10-CM | POA: Diagnosis not present

## 2019-05-31 DIAGNOSIS — R197 Diarrhea, unspecified: Secondary | ICD-10-CM | POA: Diagnosis not present

## 2019-06-02 DIAGNOSIS — R197 Diarrhea, unspecified: Secondary | ICD-10-CM | POA: Diagnosis not present

## 2019-06-09 DIAGNOSIS — M170 Bilateral primary osteoarthritis of knee: Secondary | ICD-10-CM | POA: Diagnosis not present

## 2019-06-24 DIAGNOSIS — H2513 Age-related nuclear cataract, bilateral: Secondary | ICD-10-CM | POA: Diagnosis not present

## 2019-06-24 DIAGNOSIS — H04129 Dry eye syndrome of unspecified lacrimal gland: Secondary | ICD-10-CM | POA: Diagnosis not present

## 2019-07-05 DIAGNOSIS — I10 Essential (primary) hypertension: Secondary | ICD-10-CM | POA: Diagnosis not present

## 2019-07-05 DIAGNOSIS — R5383 Other fatigue: Secondary | ICD-10-CM | POA: Diagnosis not present

## 2019-07-05 DIAGNOSIS — E559 Vitamin D deficiency, unspecified: Secondary | ICD-10-CM | POA: Diagnosis not present

## 2019-07-05 DIAGNOSIS — R197 Diarrhea, unspecified: Secondary | ICD-10-CM | POA: Diagnosis not present

## 2019-07-05 DIAGNOSIS — E782 Mixed hyperlipidemia: Secondary | ICD-10-CM | POA: Diagnosis not present

## 2019-07-14 DIAGNOSIS — I10 Essential (primary) hypertension: Secondary | ICD-10-CM | POA: Diagnosis not present

## 2019-07-14 DIAGNOSIS — E782 Mixed hyperlipidemia: Secondary | ICD-10-CM | POA: Diagnosis not present

## 2019-07-14 DIAGNOSIS — G4700 Insomnia, unspecified: Secondary | ICD-10-CM | POA: Diagnosis not present

## 2019-07-14 DIAGNOSIS — E559 Vitamin D deficiency, unspecified: Secondary | ICD-10-CM | POA: Diagnosis not present

## 2019-07-18 DIAGNOSIS — Z0000 Encounter for general adult medical examination without abnormal findings: Secondary | ICD-10-CM | POA: Diagnosis not present

## 2019-09-01 ENCOUNTER — Inpatient Hospital Stay: Payer: Medicare PPO | Attending: Oncology

## 2019-09-01 ENCOUNTER — Other Ambulatory Visit: Payer: Self-pay

## 2019-09-01 ENCOUNTER — Inpatient Hospital Stay (HOSPITAL_BASED_OUTPATIENT_CLINIC_OR_DEPARTMENT_OTHER): Payer: Medicare PPO | Admitting: Oncology

## 2019-09-01 VITALS — BP 129/78 | HR 77 | Temp 98.5°F | Resp 17 | Ht 60.05 in | Wt 168.1 lb

## 2019-09-01 DIAGNOSIS — I10 Essential (primary) hypertension: Secondary | ICD-10-CM | POA: Diagnosis not present

## 2019-09-01 DIAGNOSIS — E049 Nontoxic goiter, unspecified: Secondary | ICD-10-CM | POA: Diagnosis not present

## 2019-09-01 DIAGNOSIS — Z85048 Personal history of other malignant neoplasm of rectum, rectosigmoid junction, and anus: Secondary | ICD-10-CM | POA: Diagnosis not present

## 2019-09-01 DIAGNOSIS — C20 Malignant neoplasm of rectum: Secondary | ICD-10-CM | POA: Diagnosis not present

## 2019-09-01 DIAGNOSIS — Z87891 Personal history of nicotine dependence: Secondary | ICD-10-CM | POA: Insufficient documentation

## 2019-09-01 LAB — CEA (IN HOUSE-CHCC): CEA (CHCC-In House): <1 ng/mL (ref 0.00–5.00)

## 2019-09-01 NOTE — Progress Notes (Signed)
  [place] OFFICE PROGRESS NOTE   Diagnosis: Rectal cancer  INTERVAL HISTORY:   Amber Morse returns for a scheduled visit.  She reports feeling well.  Good appetite.  She changed her diet last summer and lost weight.  She developed diarrhea beginning in September.  She has been evaluated by Dr. Earlean Shawl and reports the diarrhea resolved and she was placed on budesonide.  The diarrhea returned when the budesonide was tapered to off.  She is now taking budesonide once daily and no longer has diarrhea.  No bleeding.  She reports undergoing a colonoscopy in June.  Objective:  Vital signs in last 24 hours:  Blood pressure 129/78, pulse 77, temperature 98.5 F (36.9 C), temperature source Temporal, resp. rate 17, height 5' 0.05" (1.525 m), weight 168 lb 1.6 oz (76.2 kg), SpO2 100 %.   Limited physical examination secondary to distancing with the Covid pandemic Lymphatics: No cervical, supraclavicular, axillary, or inguinal nodes GI: No hepatosplenomegaly, no mass, nontender     Lab Results:   Lab Results  Component Value Date   CEA1 1.00 06/25/2018     Medications: I have reviewed the patient's current medications.   Assessment/Plan: 1. Rectal cancer ? 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 Laparoscopic assisted polypectomy 03/24/2017- superficial fragments of a tubular adenoma with high-grade dysplasia, biopsy of the polyp base-dysplastic glandular epithelium involving fibrotic stroma ? Recurrent diarrhea 2019 ? Sigmoidoscopy-invasive moderately differentiated adenocarcinoma at the previous polyp site ? CTs chest, abdomen, pelvis on 12/04/2017-no evidence of metastatic disease, left thyroid goiter, small subpleural node ? MRI pelvis 12/04/2017, T2 (equivocal T3), N0 high rectal mass above the peritoneal reflection, measured at 12 cm from the anal verge ? Laparoscopic anterior resection 12/10/2017, stage II (pT3,pN0 close  (moderately differentiated adenocarcinoma of the rectum, early T3, no lymphovascular or perineural invasion, no loss of mismatch repair protein expression ? CEA on 01/07/2018 - 0.7 2. Hypertension  3.   History of tobacco use  4.   Left thyroid "goiter "noted on CT 12/04/2017  5.   Diarrhea beginning September 2020 following a weight loss diet, followed by gastroenterology     Disposition: Amber Morse is in remission from rectal cancer.  We will follow up on the CEA from today.  She will return for an office visit and CEA in 6 months.  She will continue colonoscopy surveillance and management of diarrhea by Dr. Earlean Shawl.  Betsy Coder, MD  09/01/2019  11:02 AM

## 2019-09-02 ENCOUNTER — Telehealth: Payer: Self-pay | Admitting: Oncology

## 2019-09-02 ENCOUNTER — Telehealth: Payer: Self-pay | Admitting: *Deleted

## 2019-09-02 NOTE — Telephone Encounter (Signed)
Notified of normal CEA and f/u as scheduled. 

## 2019-09-02 NOTE — Telephone Encounter (Signed)
-----   Message from Ladell Pier, MD sent at 09/01/2019  3:33 PM EST ----- Please call patient, CEA is normal, follow-up as scheduled

## 2019-11-11 DIAGNOSIS — Z6833 Body mass index (BMI) 33.0-33.9, adult: Secondary | ICD-10-CM | POA: Diagnosis not present

## 2019-11-11 DIAGNOSIS — I10 Essential (primary) hypertension: Secondary | ICD-10-CM | POA: Diagnosis not present

## 2019-11-11 DIAGNOSIS — M170 Bilateral primary osteoarthritis of knee: Secondary | ICD-10-CM | POA: Diagnosis not present

## 2019-11-11 DIAGNOSIS — G4700 Insomnia, unspecified: Secondary | ICD-10-CM | POA: Diagnosis not present

## 2019-12-08 ENCOUNTER — Other Ambulatory Visit: Payer: Self-pay | Admitting: Family Medicine

## 2019-12-08 DIAGNOSIS — Z1231 Encounter for screening mammogram for malignant neoplasm of breast: Secondary | ICD-10-CM

## 2019-12-08 DIAGNOSIS — M170 Bilateral primary osteoarthritis of knee: Secondary | ICD-10-CM | POA: Diagnosis not present

## 2019-12-15 DIAGNOSIS — M170 Bilateral primary osteoarthritis of knee: Secondary | ICD-10-CM | POA: Diagnosis not present

## 2019-12-20 DIAGNOSIS — H25043 Posterior subcapsular polar age-related cataract, bilateral: Secondary | ICD-10-CM | POA: Diagnosis not present

## 2019-12-20 DIAGNOSIS — H2513 Age-related nuclear cataract, bilateral: Secondary | ICD-10-CM | POA: Diagnosis not present

## 2019-12-20 DIAGNOSIS — H2512 Age-related nuclear cataract, left eye: Secondary | ICD-10-CM | POA: Diagnosis not present

## 2019-12-20 DIAGNOSIS — H18413 Arcus senilis, bilateral: Secondary | ICD-10-CM | POA: Diagnosis not present

## 2019-12-20 DIAGNOSIS — H25013 Cortical age-related cataract, bilateral: Secondary | ICD-10-CM | POA: Diagnosis not present

## 2019-12-22 DIAGNOSIS — M170 Bilateral primary osteoarthritis of knee: Secondary | ICD-10-CM | POA: Diagnosis not present

## 2020-02-16 DIAGNOSIS — M170 Bilateral primary osteoarthritis of knee: Secondary | ICD-10-CM | POA: Diagnosis not present

## 2020-02-16 DIAGNOSIS — J309 Allergic rhinitis, unspecified: Secondary | ICD-10-CM | POA: Diagnosis not present

## 2020-02-16 DIAGNOSIS — G4700 Insomnia, unspecified: Secondary | ICD-10-CM | POA: Diagnosis not present

## 2020-02-16 DIAGNOSIS — I10 Essential (primary) hypertension: Secondary | ICD-10-CM | POA: Diagnosis not present

## 2020-02-29 ENCOUNTER — Other Ambulatory Visit: Payer: Medicare PPO

## 2020-03-08 ENCOUNTER — Other Ambulatory Visit: Payer: Self-pay

## 2020-03-08 ENCOUNTER — Inpatient Hospital Stay: Payer: Medicare PPO | Attending: Oncology

## 2020-03-08 DIAGNOSIS — Z85048 Personal history of other malignant neoplasm of rectum, rectosigmoid junction, and anus: Secondary | ICD-10-CM | POA: Diagnosis not present

## 2020-03-08 DIAGNOSIS — C20 Malignant neoplasm of rectum: Secondary | ICD-10-CM

## 2020-03-08 LAB — CEA (IN HOUSE-CHCC): CEA (CHCC-In House): <1 ng/mL (ref 0.00–5.00)

## 2020-03-13 ENCOUNTER — Telehealth: Payer: Self-pay | Admitting: Nurse Practitioner

## 2020-03-13 NOTE — Telephone Encounter (Signed)
Scheduled per 7/19 sch message. Unable to reach pt. Left voicemail with appt time and date.

## 2020-03-27 DIAGNOSIS — F331 Major depressive disorder, recurrent, moderate: Secondary | ICD-10-CM | POA: Diagnosis not present

## 2020-03-27 DIAGNOSIS — F411 Generalized anxiety disorder: Secondary | ICD-10-CM | POA: Diagnosis not present

## 2020-03-27 DIAGNOSIS — I10 Essential (primary) hypertension: Secondary | ICD-10-CM | POA: Diagnosis not present

## 2020-03-27 DIAGNOSIS — M170 Bilateral primary osteoarthritis of knee: Secondary | ICD-10-CM | POA: Diagnosis not present

## 2020-03-27 DIAGNOSIS — G4700 Insomnia, unspecified: Secondary | ICD-10-CM | POA: Diagnosis not present

## 2020-04-12 ENCOUNTER — Ambulatory Visit: Payer: Medicare PPO | Admitting: Nurse Practitioner

## 2020-04-24 DIAGNOSIS — E039 Hypothyroidism, unspecified: Secondary | ICD-10-CM | POA: Diagnosis not present

## 2020-04-24 DIAGNOSIS — K219 Gastro-esophageal reflux disease without esophagitis: Secondary | ICD-10-CM | POA: Diagnosis not present

## 2020-04-24 DIAGNOSIS — I10 Essential (primary) hypertension: Secondary | ICD-10-CM | POA: Diagnosis not present

## 2020-04-24 DIAGNOSIS — E782 Mixed hyperlipidemia: Secondary | ICD-10-CM | POA: Diagnosis not present

## 2020-04-24 DIAGNOSIS — E559 Vitamin D deficiency, unspecified: Secondary | ICD-10-CM | POA: Diagnosis not present

## 2020-04-26 ENCOUNTER — Telehealth: Payer: Self-pay | Admitting: Oncology

## 2020-04-26 ENCOUNTER — Other Ambulatory Visit: Payer: Self-pay

## 2020-04-26 ENCOUNTER — Inpatient Hospital Stay: Payer: Medicare PPO | Attending: Oncology | Admitting: Nurse Practitioner

## 2020-04-26 ENCOUNTER — Encounter: Payer: Self-pay | Admitting: Nurse Practitioner

## 2020-04-26 VITALS — BP 136/92 | HR 109 | Temp 97.4°F | Resp 17 | Ht 60.05 in | Wt 175.1 lb

## 2020-04-26 DIAGNOSIS — C20 Malignant neoplasm of rectum: Secondary | ICD-10-CM | POA: Insufficient documentation

## 2020-04-26 DIAGNOSIS — I10 Essential (primary) hypertension: Secondary | ICD-10-CM | POA: Insufficient documentation

## 2020-04-26 DIAGNOSIS — Z87891 Personal history of nicotine dependence: Secondary | ICD-10-CM | POA: Diagnosis not present

## 2020-04-26 NOTE — Progress Notes (Signed)
  [place] OFFICE PROGRESS NOTE   Diagnosis: Rectal cancer  INTERVAL HISTORY:   Ms. [place] Morse returns for follow-up.  She feels well.  She reports a diagnosis of "microscopic colitis" a few months ago.  She completed a course of budesonide.  Bowels now moving regularly.  No bleeding.  Denies abdominal pain.  She has a good appetite.  Objective:  Vital signs in last 24 hours:  Blood pressure (!) 136/92, pulse (!) 109, temperature (!) 97.4 F (36.3 C), temperature source Tympanic, resp. rate 17, height 5' 0.05" (1.525 m), weight 175 lb 1.6 oz (79.4 kg), SpO2 100 %.    HEENT: Neck without mass. Lymphatics: No palpable cervical, supraclavicular, axillary or inguinal lymph nodes. Resp: Lungs clear bilaterally. Cardio: Regular rate and rhythm. GI: Abdomen soft and nontender.  No hepatomegaly. Vascular: No leg edema.    Lab Results:  No results found for: WBC, HGB, HCT, MCV, PLT, NEUTROABS  Imaging:  No results found.  Medications: I have reviewed the patient's current medications.  Assessment/Plan: 1. Rectal cancer ? 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 Laparoscopic assisted polypectomy 03/24/2017- superficial fragments of a tubular adenoma with high-grade dysplasia, biopsy of the polyp base-dysplastic glandular epithelium involving fibrotic stroma ? Recurrent diarrhea 2019 ? Sigmoidoscopy-invasive moderately differentiated adenocarcinoma at the previous polyp site ? CTs chest, abdomen, pelvis on 12/04/2017-no evidence of metastatic disease, left thyroid goiter, small subpleural node ? MRI pelvis 12/04/2017, T2 (equivocal T3), N0 high rectal mass above the peritoneal reflection, measured at 12 cm from the anal verge ? Laparoscopic anterior resection 12/10/2017, stage II (pT3,pN0close (moderately differentiated adenocarcinoma of the rectum, early T3, no lymphovascular or perineural invasion, no loss of mismatch repair  protein expression ? CEA on 01/07/2018 - 0.7 ? CEA on 03/08/2020-less than 1 2. Hypertension  3.History of tobacco use  4.Left thyroid "goiter "noted on CT 12/04/2017  5.   Diarrhea beginning September 2020 following a weight loss diet, followed by gastroenterology  Disposition: [facility] Morse remains in remission from rectal cancer.  CEA from last month was stable in the normal range.  She will continue colonoscopy surveillance and management of intermittent diarrhea by Dr. Earlean Shawl.  She will return for a CEA and follow-up visit in 6 months.    Ned Card ANP/GNP-BC   04/26/2020  1:04 PM

## 2020-04-26 NOTE — Telephone Encounter (Signed)
Scheduled per 09/02 los, patient has received updated calender.

## 2020-05-10 DIAGNOSIS — E782 Mixed hyperlipidemia: Secondary | ICD-10-CM | POA: Diagnosis not present

## 2020-05-10 DIAGNOSIS — K219 Gastro-esophageal reflux disease without esophagitis: Secondary | ICD-10-CM | POA: Diagnosis not present

## 2020-05-10 DIAGNOSIS — I10 Essential (primary) hypertension: Secondary | ICD-10-CM | POA: Diagnosis not present

## 2020-05-10 DIAGNOSIS — E559 Vitamin D deficiency, unspecified: Secondary | ICD-10-CM | POA: Diagnosis not present

## 2020-05-17 DIAGNOSIS — Z23 Encounter for immunization: Secondary | ICD-10-CM | POA: Diagnosis not present

## 2020-05-24 DIAGNOSIS — M170 Bilateral primary osteoarthritis of knee: Secondary | ICD-10-CM | POA: Diagnosis not present

## 2020-05-31 DIAGNOSIS — M170 Bilateral primary osteoarthritis of knee: Secondary | ICD-10-CM | POA: Diagnosis not present

## 2020-06-05 DIAGNOSIS — M170 Bilateral primary osteoarthritis of knee: Secondary | ICD-10-CM | POA: Diagnosis not present

## 2020-06-14 DIAGNOSIS — E782 Mixed hyperlipidemia: Secondary | ICD-10-CM | POA: Diagnosis not present

## 2020-06-14 DIAGNOSIS — I10 Essential (primary) hypertension: Secondary | ICD-10-CM | POA: Diagnosis not present

## 2020-07-09 DIAGNOSIS — K219 Gastro-esophageal reflux disease without esophagitis: Secondary | ICD-10-CM | POA: Diagnosis not present

## 2020-07-09 DIAGNOSIS — E782 Mixed hyperlipidemia: Secondary | ICD-10-CM | POA: Diagnosis not present

## 2020-07-09 DIAGNOSIS — G4700 Insomnia, unspecified: Secondary | ICD-10-CM | POA: Diagnosis not present

## 2020-07-09 DIAGNOSIS — E1169 Type 2 diabetes mellitus with other specified complication: Secondary | ICD-10-CM | POA: Diagnosis not present

## 2020-07-09 DIAGNOSIS — I10 Essential (primary) hypertension: Secondary | ICD-10-CM | POA: Diagnosis not present

## 2020-08-01 DIAGNOSIS — Z1339 Encounter for screening examination for other mental health and behavioral disorders: Secondary | ICD-10-CM | POA: Diagnosis not present

## 2020-08-01 DIAGNOSIS — Z0000 Encounter for general adult medical examination without abnormal findings: Secondary | ICD-10-CM | POA: Diagnosis not present

## 2020-08-01 DIAGNOSIS — Z1331 Encounter for screening for depression: Secondary | ICD-10-CM | POA: Diagnosis not present

## 2020-08-23 ENCOUNTER — Other Ambulatory Visit: Payer: Self-pay | Admitting: Family Medicine

## 2020-08-23 DIAGNOSIS — Z1231 Encounter for screening mammogram for malignant neoplasm of breast: Secondary | ICD-10-CM

## 2020-08-23 DIAGNOSIS — E2839 Other primary ovarian failure: Secondary | ICD-10-CM

## 2020-10-25 ENCOUNTER — Telehealth: Payer: Self-pay

## 2020-10-25 ENCOUNTER — Inpatient Hospital Stay: Payer: Medicare PPO | Admitting: Oncology

## 2020-10-25 ENCOUNTER — Other Ambulatory Visit: Payer: Self-pay

## 2020-10-25 ENCOUNTER — Inpatient Hospital Stay: Payer: Medicare PPO | Attending: Oncology

## 2020-10-25 VITALS — BP 137/90 | HR 97 | Temp 97.8°F | Resp 17 | Ht 60.0 in | Wt 173.0 lb

## 2020-10-25 DIAGNOSIS — R197 Diarrhea, unspecified: Secondary | ICD-10-CM | POA: Insufficient documentation

## 2020-10-25 DIAGNOSIS — C20 Malignant neoplasm of rectum: Secondary | ICD-10-CM | POA: Diagnosis not present

## 2020-10-25 LAB — CEA (IN HOUSE-CHCC): CEA (CHCC-In House): <1 ng/mL (ref 0.00–5.00)

## 2020-10-25 NOTE — Telephone Encounter (Signed)
-----   Message from Ladell Pier, MD sent at 10/25/2020  1:21 PM EST ----- Please call patient, CEA is normal, follow-up as scheduled

## 2020-10-25 NOTE — Telephone Encounter (Signed)
CEA is normal follow up as ordered

## 2020-10-25 NOTE — Progress Notes (Signed)
  [place] OFFICE PROGRESS NOTE   Diagnosis: Rectal cancer  INTERVAL HISTORY:   Amber Morse returns as scheduled.  She feels well.  Good appetite.  She continues to have "diarrhea ".  Budesonide helps.  She is followed by Dr. Earlean Shawl.  She will be due for the next colonoscopy in 2023.  No bleeding.  Objective:  Vital signs in last 24 hours:  Blood pressure 137/90, pulse 97, temperature 97.8 F (36.6 C), temperature source Tympanic, resp. rate 17, height 5' (1.524 m), weight 173 lb (78.5 kg), SpO2 99 %.    Lymphatics: No cervical, supraclavicular, axillary, or inguinal nodes Resp: Lungs clear bilaterally Cardio: Regular rate and rhythm GI: No hepatosplenomegaly, no mass, nontender Vascular: No leg edema   Medications: I have reviewed the patient's current medications.   Assessment/Plan: 1. Rectal cancer ? 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 Laparoscopic assisted polypectomy 03/24/2017- superficial fragments of a tubular adenoma with high-grade dysplasia, biopsy of the polyp base-dysplastic glandular epithelium involving fibrotic stroma ? Recurrent diarrhea 2019 ? Sigmoidoscopy-invasive moderately differentiated adenocarcinoma at the previous polyp site ? CTs chest, abdomen, pelvis on 12/04/2017-no evidence of metastatic disease, left thyroid goiter, small subpleural node ? MRI pelvis 12/04/2017, T2 (equivocal T3), N0 high rectal mass above the peritoneal reflection, measured at 12 cm from the anal verge ? Laparoscopic anterior resection 12/10/2017, stage II (pT3,pN0close (moderately differentiated adenocarcinoma of the rectum, early T3, no lymphovascular or perineural invasion, no loss of mismatch repair protein expression ? CEA on 01/07/2018 - 0.7 ? CEA on 03/08/2020-less than 1 2. Hypertension  3.History of tobacco use  4.Left thyroid "goiter "noted on CT 12/04/2017  5.   Diarrhea beginning September 2020  following a weight loss diet, followed by gastroenterology    Disposition: Amber Morse is in clinical remission from rectal cancer.  We will follow up on the CEA from today.  She will return for an office visit and CEA in 6 months.  She continues follow-up with Dr. Earlean Shawl colonoscopy surveillance and management of chronic diarrhea.  [place] Coder, MD  10/25/2020  11:33 AM

## 2020-10-29 ENCOUNTER — Telehealth: Payer: Self-pay | Admitting: Oncology

## 2020-10-29 NOTE — Telephone Encounter (Signed)
Scheduled appointments per 3/3 los. Called patient, no answer. Left message with appointments date and times.

## 2020-11-29 DIAGNOSIS — Z23 Encounter for immunization: Secondary | ICD-10-CM | POA: Diagnosis not present

## 2020-11-29 DIAGNOSIS — I10 Essential (primary) hypertension: Secondary | ICD-10-CM | POA: Diagnosis not present

## 2020-11-29 DIAGNOSIS — M170 Bilateral primary osteoarthritis of knee: Secondary | ICD-10-CM | POA: Diagnosis not present

## 2020-11-29 DIAGNOSIS — G4700 Insomnia, unspecified: Secondary | ICD-10-CM | POA: Diagnosis not present

## 2020-12-12 DIAGNOSIS — M170 Bilateral primary osteoarthritis of knee: Secondary | ICD-10-CM | POA: Diagnosis not present

## 2020-12-18 DIAGNOSIS — M170 Bilateral primary osteoarthritis of knee: Secondary | ICD-10-CM | POA: Diagnosis not present

## 2020-12-27 DIAGNOSIS — R197 Diarrhea, unspecified: Secondary | ICD-10-CM | POA: Diagnosis not present

## 2020-12-27 DIAGNOSIS — Z85048 Personal history of other malignant neoplasm of rectum, rectosigmoid junction, and anus: Secondary | ICD-10-CM | POA: Diagnosis not present

## 2020-12-27 DIAGNOSIS — M170 Bilateral primary osteoarthritis of knee: Secondary | ICD-10-CM | POA: Diagnosis not present

## 2021-01-24 DIAGNOSIS — F411 Generalized anxiety disorder: Secondary | ICD-10-CM | POA: Diagnosis not present

## 2021-01-24 DIAGNOSIS — I10 Essential (primary) hypertension: Secondary | ICD-10-CM | POA: Diagnosis not present

## 2021-01-24 DIAGNOSIS — F331 Major depressive disorder, recurrent, moderate: Secondary | ICD-10-CM | POA: Diagnosis not present

## 2021-01-24 DIAGNOSIS — R5383 Other fatigue: Secondary | ICD-10-CM | POA: Diagnosis not present

## 2021-01-24 DIAGNOSIS — G4700 Insomnia, unspecified: Secondary | ICD-10-CM | POA: Diagnosis not present

## 2021-03-04 DIAGNOSIS — I10 Essential (primary) hypertension: Secondary | ICD-10-CM | POA: Diagnosis not present

## 2021-03-04 DIAGNOSIS — E559 Vitamin D deficiency, unspecified: Secondary | ICD-10-CM | POA: Diagnosis not present

## 2021-03-04 DIAGNOSIS — F411 Generalized anxiety disorder: Secondary | ICD-10-CM | POA: Diagnosis not present

## 2021-03-04 DIAGNOSIS — R5383 Other fatigue: Secondary | ICD-10-CM | POA: Diagnosis not present

## 2021-03-07 DIAGNOSIS — E559 Vitamin D deficiency, unspecified: Secondary | ICD-10-CM | POA: Diagnosis not present

## 2021-03-07 DIAGNOSIS — G4700 Insomnia, unspecified: Secondary | ICD-10-CM | POA: Diagnosis not present

## 2021-03-07 DIAGNOSIS — J309 Allergic rhinitis, unspecified: Secondary | ICD-10-CM | POA: Diagnosis not present

## 2021-03-07 DIAGNOSIS — F411 Generalized anxiety disorder: Secondary | ICD-10-CM | POA: Diagnosis not present

## 2021-03-07 DIAGNOSIS — R5383 Other fatigue: Secondary | ICD-10-CM | POA: Diagnosis not present

## 2021-03-07 DIAGNOSIS — F331 Major depressive disorder, recurrent, moderate: Secondary | ICD-10-CM | POA: Diagnosis not present

## 2021-04-30 ENCOUNTER — Ambulatory Visit: Payer: Medicare PPO | Admitting: Nurse Practitioner

## 2021-04-30 ENCOUNTER — Other Ambulatory Visit: Payer: Medicare PPO

## 2021-05-02 ENCOUNTER — Other Ambulatory Visit: Payer: Medicare PPO

## 2021-05-02 ENCOUNTER — Ambulatory Visit: Payer: Medicare PPO | Admitting: Nurse Practitioner

## 2021-05-09 ENCOUNTER — Inpatient Hospital Stay: Payer: Medicare PPO | Admitting: Nurse Practitioner

## 2021-05-09 ENCOUNTER — Encounter: Payer: Self-pay | Admitting: Nurse Practitioner

## 2021-05-09 ENCOUNTER — Inpatient Hospital Stay: Payer: Medicare PPO | Attending: Oncology

## 2021-05-09 ENCOUNTER — Other Ambulatory Visit: Payer: Self-pay

## 2021-05-09 VITALS — BP 119/83 | HR 80 | Temp 98.1°F | Resp 18 | Ht 60.0 in | Wt 172.4 lb

## 2021-05-09 DIAGNOSIS — Z87891 Personal history of nicotine dependence: Secondary | ICD-10-CM | POA: Diagnosis not present

## 2021-05-09 DIAGNOSIS — C20 Malignant neoplasm of rectum: Secondary | ICD-10-CM

## 2021-05-09 DIAGNOSIS — Z85048 Personal history of other malignant neoplasm of rectum, rectosigmoid junction, and anus: Secondary | ICD-10-CM | POA: Diagnosis not present

## 2021-05-09 LAB — CEA (ACCESS): CEA (CHCC): 1.45 ng/mL (ref 0.00–5.00)

## 2021-05-09 NOTE — Progress Notes (Signed)
  [facility] OFFICE PROGRESS NOTE   Diagnosis: Rectal cancer  INTERVAL HISTORY:   Ms. Macchia returns as scheduled.  Bowels moving regularly.  No bloody or black stools.  No abdominal pain.  She has a good appetite.  Objective:  Vital signs in last 24 hours:  Blood pressure 119/83, pulse 80, temperature 98.1 F (36.7 C), temperature source Oral, resp. rate 18, height 5' (1.524 m), weight 172 lb 6.4 oz (78.2 kg), SpO2 100 %.    Lymphatics: No palpable cervical, supraclavicular, axillary or inguinal lymph nodes. Resp: Lungs clear bilaterally. Cardio: Regular rate and rhythm. GI: Abdomen soft and nontender.  No hepatosplenomegaly.  No mass. Vascular: Trace edema at the right ankle (chronic per patient report).  Calf soft and nontender.   Lab Results:  No results found for: WBC, HGB, HCT, MCV, PLT, NEUTROABS  Imaging:  No results found.  Medications: I have reviewed the patient's current medications.  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 Laparoscopic assisted polypectomy 03/24/2017- superficial fragments of a tubular adenoma with high-grade dysplasia, biopsy of the polyp base-dysplastic glandular epithelium involving fibrotic stroma Recurrent diarrhea 2019 Sigmoidoscopy-invasive moderately differentiated adenocarcinoma at the previous polyp site CTs chest, abdomen, pelvis on 12/04/2017-no evidence of metastatic disease, left thyroid goiter, small subpleural node MRI pelvis 12/04/2017, T2 (equivocal T3), N0 high rectal mass above the peritoneal reflection, measured at 12 cm from the anal verge Laparoscopic anterior resection 12/10/2017, stage II (pT3,pN0 close (moderately differentiated adenocarcinoma of the rectum, early T3, no lymphovascular or perineural invasion, no loss of mismatch repair protein expression CEA on 01/07/2018 - 0.7 CEA on 03/08/2020-less than 1 Hypertension   3.    History of tobacco use   4.   Left thyroid "goiter "noted on CT 12/04/2017   5.   Diarrhea beginning September 2020 following a weight loss diet, followed by gastroenterology     Disposition: Ms. Bachicha remains in clinical remission from rectal cancer.  We will follow-up on the CEA from today.  She will return for a CEA and follow-up visit in 6 months.    Ned Card ANP/GNP-BC   05/09/2021  11:50 AM

## 2021-05-10 LAB — CEA (IN HOUSE-CHCC): CEA (CHCC-In House): 1.08 ng/mL (ref 0.00–5.00)

## 2021-06-20 DIAGNOSIS — S42291A Other displaced fracture of upper end of right humerus, initial encounter for closed fracture: Secondary | ICD-10-CM | POA: Diagnosis not present

## 2021-06-20 DIAGNOSIS — M25519 Pain in unspecified shoulder: Secondary | ICD-10-CM | POA: Diagnosis not present

## 2021-06-20 DIAGNOSIS — S43401A Unspecified sprain of right shoulder joint, initial encounter: Secondary | ICD-10-CM | POA: Diagnosis not present

## 2021-06-20 DIAGNOSIS — R0902 Hypoxemia: Secondary | ICD-10-CM | POA: Diagnosis not present

## 2021-06-20 DIAGNOSIS — S42351A Displaced comminuted fracture of shaft of humerus, right arm, initial encounter for closed fracture: Secondary | ICD-10-CM | POA: Diagnosis not present

## 2021-06-20 DIAGNOSIS — R0689 Other abnormalities of breathing: Secondary | ICD-10-CM | POA: Diagnosis not present

## 2021-06-25 DIAGNOSIS — S42201A Unspecified fracture of upper end of right humerus, initial encounter for closed fracture: Secondary | ICD-10-CM | POA: Diagnosis not present

## 2021-06-25 DIAGNOSIS — M25511 Pain in right shoulder: Secondary | ICD-10-CM | POA: Diagnosis not present

## 2021-06-26 ENCOUNTER — Encounter (HOSPITAL_COMMUNITY): Payer: Self-pay | Admitting: Orthopedic Surgery

## 2021-06-26 NOTE — Progress Notes (Signed)
EKG: DOS CXR: na ECHO: denies Stress Test: denies Cardiac Cath: denies  Fasting Blood Sugar- na Checks Blood Sugar__na_ times a day  ASA/Blood Thinner: No  OSA/CPAP: No  Covid test DOS.  Patient in too much pain to come for testing today.   Anesthesia Review: No  Patient denies shortness of breath, fever, cough, and chest pain at PAT appointment.  Patient verbalized understanding of instructions provided today at the PAT appointment.  Patient asked to review instructions at home and day of surgery.

## 2021-06-27 ENCOUNTER — Encounter (HOSPITAL_COMMUNITY): Payer: Self-pay | Admitting: Orthopedic Surgery

## 2021-06-27 ENCOUNTER — Other Ambulatory Visit: Payer: Self-pay

## 2021-06-27 ENCOUNTER — Ambulatory Visit (HOSPITAL_COMMUNITY): Payer: Medicare PPO | Admitting: Certified Registered"

## 2021-06-27 ENCOUNTER — Encounter (HOSPITAL_COMMUNITY)
Admission: RE | Disposition: A | Discharge status: Home / Self Care | Payer: Self-pay | Source: Home / Self Care | Attending: Orthopedic Surgery

## 2021-06-27 ENCOUNTER — Ambulatory Visit (HOSPITAL_COMMUNITY): Payer: Medicare PPO

## 2021-06-27 ENCOUNTER — Observation Stay (HOSPITAL_COMMUNITY)
Admission: RE | Admit: 2021-06-27 | Discharge: 2021-06-28 | Disposition: A | Discharge status: Home / Self Care | Payer: Medicare PPO | Attending: Orthopedic Surgery | Admitting: Orthopedic Surgery

## 2021-06-27 DIAGNOSIS — I10 Essential (primary) hypertension: Secondary | ICD-10-CM | POA: Diagnosis not present

## 2021-06-27 DIAGNOSIS — Z96611 Presence of right artificial shoulder joint: Secondary | ICD-10-CM | POA: Diagnosis not present

## 2021-06-27 DIAGNOSIS — W19XXXA Unspecified fall, initial encounter: Secondary | ICD-10-CM | POA: Diagnosis not present

## 2021-06-27 DIAGNOSIS — Z85048 Personal history of other malignant neoplasm of rectum, rectosigmoid junction, and anus: Secondary | ICD-10-CM | POA: Insufficient documentation

## 2021-06-27 DIAGNOSIS — Z87891 Personal history of nicotine dependence: Secondary | ICD-10-CM | POA: Diagnosis not present

## 2021-06-27 DIAGNOSIS — S42241A 4-part fracture of surgical neck of right humerus, initial encounter for closed fracture: Secondary | ICD-10-CM | POA: Diagnosis not present

## 2021-06-27 DIAGNOSIS — Z471 Aftercare following joint replacement surgery: Secondary | ICD-10-CM | POA: Diagnosis not present

## 2021-06-27 DIAGNOSIS — Z79899 Other long term (current) drug therapy: Secondary | ICD-10-CM | POA: Insufficient documentation

## 2021-06-27 DIAGNOSIS — M6281 Muscle weakness (generalized): Secondary | ICD-10-CM | POA: Diagnosis not present

## 2021-06-27 DIAGNOSIS — K219 Gastro-esophageal reflux disease without esophagitis: Secondary | ICD-10-CM | POA: Diagnosis not present

## 2021-06-27 DIAGNOSIS — M85811 Other specified disorders of bone density and structure, right shoulder: Secondary | ICD-10-CM | POA: Diagnosis not present

## 2021-06-27 DIAGNOSIS — G8918 Other acute postprocedural pain: Secondary | ICD-10-CM | POA: Diagnosis not present

## 2021-06-27 DIAGNOSIS — Z20822 Contact with and (suspected) exposure to covid-19: Secondary | ICD-10-CM | POA: Diagnosis not present

## 2021-06-27 HISTORY — DX: Gastro-esophageal reflux disease without esophagitis: K21.9

## 2021-06-27 HISTORY — DX: Malignant (primary) neoplasm, unspecified: C80.1

## 2021-06-27 HISTORY — PX: REVERSE SHOULDER ARTHROPLASTY: SHX5054

## 2021-06-27 HISTORY — DX: Personal history of urinary calculi: Z87.442

## 2021-06-27 HISTORY — DX: Unspecified osteoarthritis, unspecified site: M19.90

## 2021-06-27 LAB — BASIC METABOLIC PANEL
Anion gap: 10 (ref 5–15)
BUN: 17 mg/dL (ref 8–23)
CO2: 22 mmol/L (ref 22–32)
Calcium: 9.8 mg/dL (ref 8.9–10.3)
Chloride: 105 mmol/L (ref 98–111)
Creatinine, Ser: 0.99 mg/dL (ref 0.44–1.00)
GFR, Estimated: 60 mL/min — ABNORMAL LOW (ref >60)
Glucose, Bld: 98 mg/dL (ref 70–99)
Potassium: 3.9 mmol/L (ref 3.5–5.1)
Sodium: 137 mmol/L (ref 135–145)

## 2021-06-27 LAB — CBC
HCT: 28.4 % — ABNORMAL LOW (ref 36.0–46.0)
Hemoglobin: 9.6 g/dL — ABNORMAL LOW (ref 12.0–15.0)
MCH: 31.9 pg (ref 26.0–34.0)
MCHC: 33.8 g/dL (ref 30.0–36.0)
MCV: 94.4 fL (ref 80.0–100.0)
Platelets: 296 10*3/uL (ref 150–400)
RBC: 3.01 MIL/uL — ABNORMAL LOW (ref 3.87–5.11)
RDW: 12.8 % (ref 11.5–15.5)
WBC: 5.4 10*3/uL (ref 4.0–10.5)
nRBC: 0 % (ref 0.0–0.2)

## 2021-06-27 LAB — SURGICAL PCR SCREEN
MRSA, PCR: NEGATIVE
Staphylococcus aureus: NEGATIVE

## 2021-06-27 LAB — SARS CORONAVIRUS 2 BY RT PCR (HOSPITAL ORDER, PERFORMED IN ~~LOC~~ HOSPITAL LAB): SARS Coronavirus 2: NEGATIVE

## 2021-06-27 SURGERY — ARTHROPLASTY, SHOULDER, TOTAL, REVERSE
Anesthesia: General | Site: Shoulder | Laterality: Right

## 2021-06-27 MED ORDER — FENTANYL CITRATE (PF) 100 MCG/2ML IJ SOLN: 25.0000 ug | INTRAMUSCULAR | Status: DC | PRN

## 2021-06-27 MED ORDER — SUCCINYLCHOLINE CHLORIDE 200 MG/10ML IV SOSY
PREFILLED_SYRINGE | INTRAVENOUS | Status: AC
  Filled 2021-06-27: qty 10

## 2021-06-27 MED ORDER — OXYCODONE HCL 5 MG PO TABS
ORAL_TABLET | ORAL | Status: AC
  Administered 2021-06-27: 5 mg via ORAL
  Filled 2021-06-27: qty 1

## 2021-06-27 MED ORDER — AMISULPRIDE (ANTIEMETIC) 5 MG/2ML IV SOLN: 10.0000 mg | Freq: Once | INTRAVENOUS | Status: DC | PRN

## 2021-06-27 MED ORDER — PHENYLEPHRINE HCL (PRESSORS) 10 MG/ML IV SOLN
INTRAVENOUS | Status: AC
  Filled 2021-06-27: qty 1

## 2021-06-27 MED ORDER — CHLORHEXIDINE GLUCONATE 0.12 % MT SOLN
OROMUCOSAL | Status: AC
  Administered 2021-06-27: 15 mL via OROMUCOSAL
  Filled 2021-06-27: qty 15

## 2021-06-27 MED ORDER — METHOCARBAMOL 500 MG PO TABS: 500.0000 mg | ORAL_TABLET | Freq: Four times a day (QID) | ORAL | Status: DC | PRN

## 2021-06-27 MED ORDER — PROMETHAZINE HCL 25 MG/ML IJ SOLN: 6.2500 mg | INTRAMUSCULAR | Status: DC | PRN

## 2021-06-27 MED ORDER — ESMOLOL HCL 100 MG/10ML IV SOLN
INTRAVENOUS | Status: AC
  Filled 2021-06-27: qty 10

## 2021-06-27 MED ORDER — CHLORHEXIDINE GLUCONATE 0.12 % MT SOLN: 15.0000 mL | Freq: Once | OROMUCOSAL | Status: AC

## 2021-06-27 MED ORDER — LACTATED RINGERS IV SOLN: INTRAVENOUS | Status: DC

## 2021-06-27 MED ORDER — PROPOFOL 10 MG/ML IV BOLUS
INTRAVENOUS | Status: AC
  Filled 2021-06-27: qty 20

## 2021-06-27 MED ORDER — CELECOXIB 200 MG PO CAPS
200.0000 mg | ORAL_CAPSULE | Freq: Once | ORAL | Status: AC
  Administered 2021-06-27: 200 mg via ORAL
  Filled 2021-06-27: qty 1

## 2021-06-27 MED ORDER — ORAL CARE MOUTH RINSE: 15.0000 mL | Freq: Once | OROMUCOSAL | Status: AC

## 2021-06-27 MED ORDER — DEXAMETHASONE SODIUM PHOSPHATE 10 MG/ML IJ SOLN
INTRAMUSCULAR | Status: DC | PRN
Start: 2021-06-27 — End: 2021-06-27
  Administered 2021-06-27: 10 mg via INTRAVENOUS

## 2021-06-27 MED ORDER — CEFAZOLIN SODIUM-DEXTROSE 2-4 GM/100ML-% IV SOLN
INTRAVENOUS | Status: AC
  Filled 2021-06-27: qty 100

## 2021-06-27 MED ORDER — ONDANSETRON HCL 4 MG/2ML IJ SOLN: 4.0000 mg | Freq: Four times a day (QID) | INTRAMUSCULAR | Status: DC | PRN

## 2021-06-27 MED ORDER — ACETAMINOPHEN 500 MG PO TABS
1000.0000 mg | ORAL_TABLET | Freq: Once | ORAL | Status: AC
  Administered 2021-06-27: 500 mg via ORAL
  Filled 2021-06-27: qty 2

## 2021-06-27 MED ORDER — PHENOL 1.4 % MT LIQD: 1.0000 | OROMUCOSAL | Status: DC | PRN

## 2021-06-27 MED ORDER — FENTANYL CITRATE (PF) 250 MCG/5ML IJ SOLN
INTRAMUSCULAR | Status: DC | PRN
  Administered 2021-06-27: 75 ug via INTRAVENOUS

## 2021-06-27 MED ORDER — ONDANSETRON HCL 4 MG PO TABS: 4.0000 mg | ORAL_TABLET | Freq: Four times a day (QID) | ORAL | Status: DC | PRN

## 2021-06-27 MED ORDER — ESMOLOL HCL 100 MG/10ML IV SOLN
INTRAVENOUS | Status: DC | PRN
  Administered 2021-06-27 (×3): 30 mg via INTRAVENOUS

## 2021-06-27 MED ORDER — PHENYLEPHRINE HCL-NACL 20-0.9 MG/250ML-% IV SOLN
INTRAVENOUS | Status: DC | PRN
  Administered 2021-06-27: 20 ug/min via INTRAVENOUS
  Administered 2021-06-27: 15 ug/min via INTRAVENOUS

## 2021-06-27 MED ORDER — PHENYLEPHRINE HCL (PRESSORS) 10 MG/ML IV SOLN
INTRAVENOUS | Status: DC | PRN
  Administered 2021-06-27 (×2): 80 ug via INTRAVENOUS

## 2021-06-27 MED ORDER — FAMOTIDINE 20 MG PO TABS: 20.0000 mg | ORAL_TABLET | Freq: Two times a day (BID) | ORAL | Status: DC | PRN

## 2021-06-27 MED ORDER — MIDAZOLAM HCL 2 MG/2ML IJ SOLN
INTRAMUSCULAR | Status: AC
  Filled 2021-06-27: qty 2

## 2021-06-27 MED ORDER — METOCLOPRAMIDE HCL 5 MG PO TABS: 5.0000 mg | ORAL_TABLET | Freq: Three times a day (TID) | ORAL | Status: DC | PRN

## 2021-06-27 MED ORDER — METOCLOPRAMIDE HCL 5 MG/ML IJ SOLN: 5.0000 mg | Freq: Three times a day (TID) | INTRAMUSCULAR | Status: DC | PRN

## 2021-06-27 MED ORDER — PROPOFOL 500 MG/50ML IV EMUL
INTRAVENOUS | Status: DC | PRN
  Administered 2021-06-27: 10 ug/kg/min via INTRAVENOUS

## 2021-06-27 MED ORDER — DOCUSATE SODIUM 100 MG PO CAPS
100.0000 mg | ORAL_CAPSULE | Freq: Two times a day (BID) | ORAL | Status: DC
  Administered 2021-06-27 – 2021-06-28 (×2): 100 mg via ORAL
  Filled 2021-06-27 (×2): qty 1

## 2021-06-27 MED ORDER — TRANEXAMIC ACID-NACL 1000-0.7 MG/100ML-% IV SOLN
INTRAVENOUS | Status: AC
  Filled 2021-06-27: qty 100

## 2021-06-27 MED ORDER — BUDESONIDE 3 MG PO CPEP
9.0000 mg | ORAL_CAPSULE | Freq: Every day | ORAL | Status: DC | PRN
  Filled 2021-06-27: qty 3

## 2021-06-27 MED ORDER — BUPIVACAINE LIPOSOME 1.3 % IJ SUSP
INTRAMUSCULAR | Status: DC | PRN
  Administered 2021-06-27: 10 mL via PERINEURAL

## 2021-06-27 MED ORDER — BUPIVACAINE HCL (PF) 0.5 % IJ SOLN
INTRAMUSCULAR | Status: DC | PRN
  Administered 2021-06-27: 15 mL via PERINEURAL

## 2021-06-27 MED ORDER — VANCOMYCIN HCL 1000 MG IV SOLR
INTRAVENOUS | Status: AC
  Filled 2021-06-27: qty 20

## 2021-06-27 MED ORDER — FENTANYL CITRATE (PF) 100 MCG/2ML IJ SOLN: 100.0000 ug | Freq: Once | INTRAMUSCULAR | Status: AC

## 2021-06-27 MED ORDER — ROCURONIUM BROMIDE 10 MG/ML (PF) SYRINGE
PREFILLED_SYRINGE | INTRAVENOUS | Status: DC | PRN
  Administered 2021-06-27: 60 mg via INTRAVENOUS

## 2021-06-27 MED ORDER — OXYCODONE HCL 5 MG PO TABS: 5.0000 mg | ORAL_TABLET | ORAL | Status: DC | PRN

## 2021-06-27 MED ORDER — PHENYLEPHRINE 40 MCG/ML (10ML) SYRINGE FOR IV PUSH (FOR BLOOD PRESSURE SUPPORT): PREFILLED_SYRINGE | INTRAVENOUS | Status: DC | PRN

## 2021-06-27 MED ORDER — OXYCODONE HCL 5 MG PO TABS: 10.0000 mg | ORAL_TABLET | ORAL | Status: DC | PRN

## 2021-06-27 MED ORDER — PROPOFOL 10 MG/ML IV BOLUS
INTRAVENOUS | Status: DC | PRN
  Administered 2021-06-27: 140 mg via INTRAVENOUS

## 2021-06-27 MED ORDER — FENTANYL CITRATE (PF) 100 MCG/2ML IJ SOLN
INTRAMUSCULAR | Status: AC
  Administered 2021-06-27: 100 ug via INTRAVENOUS
  Filled 2021-06-27: qty 2

## 2021-06-27 MED ORDER — SUGAMMADEX SODIUM 200 MG/2ML IV SOLN
INTRAVENOUS | Status: DC | PRN
  Administered 2021-06-27: 200 mg via INTRAVENOUS

## 2021-06-27 MED ORDER — TRANEXAMIC ACID-NACL 1000-0.7 MG/100ML-% IV SOLN
1000.0000 mg | INTRAVENOUS | Status: AC
  Administered 2021-06-27: 1000 mg via INTRAVENOUS

## 2021-06-27 MED ORDER — 0.9 % SODIUM CHLORIDE (POUR BTL) OPTIME
TOPICAL | Status: DC | PRN
  Administered 2021-06-27 (×2): 1000 mL

## 2021-06-27 MED ORDER — FENTANYL CITRATE (PF) 250 MCG/5ML IJ SOLN
INTRAMUSCULAR | Status: AC
  Filled 2021-06-27: qty 5

## 2021-06-27 MED ORDER — TRANEXAMIC ACID-NACL 1000-0.7 MG/100ML-% IV SOLN
1000.0000 mg | Freq: Once | INTRAVENOUS | Status: AC
  Administered 2021-06-27: 1000 mg via INTRAVENOUS
  Filled 2021-06-27: qty 100

## 2021-06-27 MED ORDER — PHENYLEPHRINE HCL (PRESSORS) 10 MG/ML IV SOLN
INTRAVENOUS | Status: DC | PRN
  Administered 2021-06-27: 80 ug via INTRAVENOUS
  Administered 2021-06-27: 120 ug via INTRAVENOUS
  Administered 2021-06-27: 80 ug via INTRAVENOUS

## 2021-06-27 MED ORDER — LOSARTAN POTASSIUM 50 MG PO TABS
50.0000 mg | ORAL_TABLET | Freq: Every morning | ORAL | Status: DC
  Administered 2021-06-28: 50 mg via ORAL
  Filled 2021-06-27: qty 1

## 2021-06-27 MED ORDER — VANCOMYCIN HCL 1 G IV SOLR
INTRAVENOUS | Status: DC | PRN
  Administered 2021-06-27: 1000 mg

## 2021-06-27 MED ORDER — ROSUVASTATIN CALCIUM 5 MG PO TABS
5.0000 mg | ORAL_TABLET | Freq: Every evening | ORAL | Status: DC
  Administered 2021-06-27: 5 mg via ORAL
  Filled 2021-06-27: qty 1

## 2021-06-27 MED ORDER — ACETAMINOPHEN 325 MG PO TABS: 325.0000 mg | ORAL_TABLET | Freq: Four times a day (QID) | ORAL | Status: DC | PRN

## 2021-06-27 MED ORDER — OXYCODONE HCL 5 MG PO CAPS
5.0000 mg | ORAL_CAPSULE | ORAL | 0 refills | Status: DC | PRN
Start: 2021-06-27 — End: 2021-11-08

## 2021-06-27 MED ORDER — MENTHOL 3 MG MT LOZG: 1.0000 | LOZENGE | OROMUCOSAL | Status: DC | PRN

## 2021-06-27 MED ORDER — CEFAZOLIN SODIUM-DEXTROSE 1-4 GM/50ML-% IV SOLN
1.0000 g | Freq: Four times a day (QID) | INTRAVENOUS | Status: DC
  Administered 2021-06-27 – 2021-06-28 (×2): 1 g via INTRAVENOUS
  Filled 2021-06-27 (×2): qty 50

## 2021-06-27 MED ORDER — OXYCODONE HCL 5 MG PO TABS: 5.0000 mg | ORAL_TABLET | Freq: Once | ORAL | Status: AC

## 2021-06-27 MED ORDER — METHOCARBAMOL 1000 MG/10ML IJ SOLN
500.0000 mg | Freq: Four times a day (QID) | INTRAVENOUS | Status: DC | PRN
  Filled 2021-06-27: qty 5

## 2021-06-27 MED ORDER — ROCURONIUM BROMIDE 10 MG/ML (PF) SYRINGE
PREFILLED_SYRINGE | INTRAVENOUS | Status: AC
  Filled 2021-06-27: qty 10

## 2021-06-27 MED ORDER — ASPIRIN EC 325 MG PO TBEC
325.0000 mg | DELAYED_RELEASE_TABLET | Freq: Every day | ORAL | Status: DC
  Administered 2021-06-28: 325 mg via ORAL
  Filled 2021-06-27: qty 1

## 2021-06-27 MED ORDER — CEFAZOLIN SODIUM-DEXTROSE 2-4 GM/100ML-% IV SOLN
2.0000 g | INTRAVENOUS | Status: AC
  Administered 2021-06-27: 2 g via INTRAVENOUS

## 2021-06-27 MED ORDER — HYDROMORPHONE HCL 1 MG/ML IJ SOLN: 0.5000 mg | INTRAMUSCULAR | Status: DC | PRN

## 2021-06-27 MED ORDER — ACETAMINOPHEN 500 MG PO TABS
1000.0000 mg | ORAL_TABLET | Freq: Four times a day (QID) | ORAL | Status: DC
  Administered 2021-06-27 – 2021-06-28 (×2): 1000 mg via ORAL
  Filled 2021-06-27 (×2): qty 2

## 2021-06-27 MED ORDER — ONDANSETRON 4 MG PO TBDP: 4.0000 mg | ORAL_TABLET | Freq: Three times a day (TID) | ORAL | 0 refills | Status: DC | PRN

## 2021-06-27 MED ORDER — ONDANSETRON HCL 4 MG/2ML IJ SOLN
INTRAMUSCULAR | Status: AC
  Filled 2021-06-27: qty 2

## 2021-06-27 SURGICAL SUPPLY — 72 items
AID PSTN UNV HD RSTRNT DISP (MISCELLANEOUS) ×1
BAG COUNTER SPONGE SURGICOUNT (BAG) ×2 IMPLANT
BAG SPNG CNTER NS LX DISP (BAG) ×1
BAG SURGICOUNT SPONGE COUNTING (BAG) ×1
BIT DRILL 5/64X5 DISP (BIT) ×1 IMPLANT
BIT DRILL FLUTED 3.0 STRL (BIT) ×2 IMPLANT
BLADE SAG 18X100X1.27 (BLADE) ×3 IMPLANT
BSPLAT GLND +2X24 MDLR (Joint) ×1 IMPLANT
CLOSURE WOUND 1/2 X4 (GAUZE/BANDAGES/DRESSINGS) ×1
COVER SURGICAL LIGHT HANDLE (MISCELLANEOUS) ×3 IMPLANT
CUP SUT UNIV REVERS 36 NEUTRAL (Cup) ×2 IMPLANT
DRAPE IMP U-DRAPE 54X76 (DRAPES) ×6 IMPLANT
DRAPE INCISE IOBAN 66X45 STRL (DRAPES) ×3 IMPLANT
DRAPE ORTHO SPLIT 77X108 STRL (DRAPES) ×6
DRAPE SURG 17X23 STRL (DRAPES) ×3 IMPLANT
DRAPE SURG ORHT 6 SPLT 77X108 (DRAPES) ×2 IMPLANT
DRAPE U-SHAPE 47X51 STRL (DRAPES) ×3 IMPLANT
DRESSING AQUACEL AG SP 3.5X6 (GAUZE/BANDAGES/DRESSINGS) ×1 IMPLANT
DRSG AQUACEL AG ADV 3.5X10 (GAUZE/BANDAGES/DRESSINGS) ×3 IMPLANT
DRSG AQUACEL AG SP 3.5X6 (GAUZE/BANDAGES/DRESSINGS)
DURAPREP 26ML APPLICATOR (WOUND CARE) ×5 IMPLANT
ELECT BLADE 4.0 EZ CLEAN MEGAD (MISCELLANEOUS) ×3
ELECT REM PT RETURN 9FT ADLT (ELECTROSURGICAL) ×3
ELECTRODE BLDE 4.0 EZ CLN MEGD (MISCELLANEOUS) ×1 IMPLANT
ELECTRODE REM PT RTRN 9FT ADLT (ELECTROSURGICAL) ×1 IMPLANT
FACESHIELD WRAPAROUND (MASK) ×3 IMPLANT
FACESHIELD WRAPAROUND OR TEAM (MASK) ×1 IMPLANT
GLENOID UNI REV MOD 24 +2 LAT (Joint) ×2 IMPLANT
GLENOSPHERE 36 +4 LAT/24 (Joint) ×2 IMPLANT
GLOVE SRG 8 PF TXTR STRL LF DI (GLOVE) ×2 IMPLANT
GLOVE SURG ENC MOIS LTX SZ7.5 (GLOVE) ×6 IMPLANT
GLOVE SURG UNDER POLY LF SZ8 (GLOVE) ×6
GOWN STRL REUS W/ TWL LRG LVL3 (GOWN DISPOSABLE) ×1 IMPLANT
GOWN STRL REUS W/ TWL XL LVL3 (GOWN DISPOSABLE) ×2 IMPLANT
GOWN STRL REUS W/TWL LRG LVL3 (GOWN DISPOSABLE) ×3
GOWN STRL REUS W/TWL XL LVL3 (GOWN DISPOSABLE) ×6
INSERT HUMERAL 36 +6 (Shoulder) ×2 IMPLANT
KIT BASIN OR (CUSTOM PROCEDURE TRAY) ×3 IMPLANT
KIT TURNOVER KIT B (KITS) ×3 IMPLANT
MANIFOLD NEPTUNE II (INSTRUMENTS) ×3 IMPLANT
NDL HYPO 25GX1X1/2 BEV (NEEDLE) ×1 IMPLANT
NEEDLE HYPO 25GX1X1/2 BEV (NEEDLE) ×3 IMPLANT
NS IRRIG 1000ML POUR BTL (IV SOLUTION) ×3 IMPLANT
PACK SHOULDER (CUSTOM PROCEDURE TRAY) ×3 IMPLANT
PAD ARMBOARD 7.5X6 YLW CONV (MISCELLANEOUS) ×6 IMPLANT
PIN NITINOL TARGETER 2.8 (PIN) ×2 IMPLANT
PIN SET MODULAR GLENOID SYSTEM (PIN) ×2 IMPLANT
RESTRAINT HEAD UNIVERSAL NS (MISCELLANEOUS) ×3 IMPLANT
SCREW CENTRAL MOD 30MM (Screw) ×2 IMPLANT
SCREW PERI LOCK 5.5X16 (Screw) ×4 IMPLANT
SCREW PERI LOCK 5.5X32 (Screw) ×2 IMPLANT
SCREW PERIPHERAL 5.5X20 LOCK (Screw) ×2 IMPLANT
SLING ARM IMMOBILIZER LRG (SOFTGOODS) IMPLANT
SLING ARM IMMOBILIZER MED (SOFTGOODS) IMPLANT
SPONGE T-LAP 18X18 ~~LOC~~+RFID (SPONGE) ×2 IMPLANT
SPONGE T-LAP 4X18 ~~LOC~~+RFID (SPONGE) ×5 IMPLANT
STEM HUMERAL UNI REVERS SZ9 (Stem) ×2 IMPLANT
STRIP CLOSURE SKIN 1/2X4 (GAUZE/BANDAGES/DRESSINGS) ×2 IMPLANT
SUT FIBERWIRE #2 38 T-5 BLUE (SUTURE) ×3
SUT MNCRL AB 3-0 PS2 18 (SUTURE) ×3 IMPLANT
SUT VIC AB 1 CT1 27 (SUTURE)
SUT VIC AB 1 CT1 27XBRD ANBCTR (SUTURE) IMPLANT
SUT VIC AB 2-0 CT1 27 (SUTURE) ×6
SUT VIC AB 2-0 CT1 TAPERPNT 27 (SUTURE) ×1 IMPLANT
SUTURE FIBERWR #2 38 T-5 BLUE (SUTURE) ×1 IMPLANT
SUTURE TAPE 1.3 40 TPR END (SUTURE) IMPLANT
SUTURETAPE 1.3 40 TPR END (SUTURE) ×9
SYR CONTROL 10ML LL (SYRINGE) ×3 IMPLANT
TOWEL GREEN STERILE (TOWEL DISPOSABLE) ×3 IMPLANT
TOWEL GREEN STERILE FF (TOWEL DISPOSABLE) ×3 IMPLANT
WATER STERILE IRR 1000ML POUR (IV SOLUTION) ×3 IMPLANT
YANKAUER SUCT BULB TIP NO VENT (SUCTIONS) ×3 IMPLANT

## 2021-06-27 NOTE — Brief Op Note (Signed)
06/27/2021  7:45 PM  PATIENT:  Amber Morse  75 y.o. female  PRE-OPERATIVE DIAGNOSIS:  Right shoulder proximal humerus fracture  POST-OPERATIVE DIAGNOSIS:  Right shoulder proximal humerus fracture  PROCEDURE:  Procedure(s) with comments: REVERSE SHOULDER ARTHROPLASTY (Right) - 150  SURGEON:  Surgeon(s) and Role:    * Stann Mainland, Elly Modena, MD - Primary  PHYSICIAN ASSISTANT: Jonelle Sidle, PA-C   ANESTHESIA:   regional and general  EBL:  200 mL   BLOOD ADMINISTERED:none  DRAINS: none   LOCAL MEDICATIONS USED:  NONE  SPECIMEN:  No Specimen  DISPOSITION OF SPECIMEN:  N/A  COUNTS:  YES  TOURNIQUET:  * No tourniquets in log *  DICTATION: .Note written in EPIC  PLAN OF CARE: Discharge to home after PACU  PATIENT DISPOSITION:  PACU - hemodynamically stable.   Delay start of Pharmacological VTE agent (>24hrs) due to surgical blood loss or risk of bleeding: not applicable

## 2021-06-27 NOTE — Anesthesia Procedure Notes (Signed)
Procedure Name: Intubation Date/Time: 06/27/2021 6:59 PM Performed by: Maude Leriche, CRNA Pre-anesthesia Checklist: Patient identified, Emergency Drugs available, Suction available and Patient being monitored Patient Re-evaluated:Patient Re-evaluated prior to induction Oxygen Delivery Method: Circle system utilized Preoxygenation: Pre-oxygenation with 100% oxygen Induction Type: IV induction Ventilation: Mask ventilation without difficulty Laryngoscope Size: Miller and 2 Grade View: Grade I Tube type: Oral Tube size: 6.5 mm Number of attempts: 1 Airway Equipment and Method: Stylet Placement Confirmation: ETT inserted through vocal cords under direct vision, positive ETCO2 and breath sounds checked- equal and bilateral Secured at: 23 cm Tube secured with: Tape Dental Injury: Teeth and Oropharynx as per pre-operative assessment

## 2021-06-27 NOTE — H&P (Signed)
ORTHOPAEDIC H and P  REQUESTING PHYSICIAN: Amber Stairs, MD  PCP:  Amber Bien, MD  Chief Complaint: Right proximal humerus fracture  HPI: Amber Morse is a 75 y.o. female who complains of right shoulder pain and deformity as well as bruising following a fall last week.  She was evaluated in the emergency department in [facility].  There she was noted to have a four-part proximal humerus fracture.  CT scan confirmed head splitting involvement.  She is here today for arthroplasty for management.  Past Medical History:  Diagnosis Date   Arthritis    Cancer ([place])    rectal   GERD (gastroesophageal reflux disease)    Hematuria    (W/u  neg 2011)   History of kidney stones    Hypertension    Palpitation    Past Surgical History:  Procedure Laterality Date   COLON RESECTION     TUBAL LIGATION     Social History   Socioeconomic History   Marital status: Divorced    Spouse name: Not on file   Number of children: Not on file   Years of education: Not on file   Highest education level: Not on file  Occupational History   Not on file  Tobacco Use   Smoking status: Former    Types: Cigarettes   Smokeless tobacco: Never  Vaping Use   Vaping Use: Never used  Substance and Sexual Activity   Alcohol use: Not Currently   Drug use: Never   Sexual activity: Not on file  Other Topics Concern   Not on file  Social History Narrative   Tobacco use cigarettes : Current smoker, Frequency : intermittent smoker, Tobacco history last updated 09/16/2013.Smoking :yes. No Alcohol. Caffeine:  Yes, 4-5 serving daily,coffee , no recreational drug use. No Diet.no Exercise.   Occupation: Ecologist. Marital Status: married .Children 3   Social Determinants of Health   Financial Resource Strain: Not on file  Food Insecurity: Not on file  Transportation Needs: Not on file  Physical Activity: Not on file  Stress: Not on file  Social Connections: Not on file    Family History  Problem Relation Age of Onset   Heart attack Father    CAD Father    Endometrial cancer Sister    Hypertension Sister    Diabetes Sister    Renal cancer Brother        renal cell carcinoma   Allergies  Allergen Reactions   Lisinopril Cough   Prior to Admission medications   Medication Sig Start Date End Date Taking? Authorizing Provider  acetaminophen (TYLENOL) 500 MG tablet Take 500 mg by mouth every 6 (six) hours as needed.   Yes [provider]  acetaminophen (TYLENOL) 650 MG CR tablet Take 650 mg by mouth every 8 (eight) hours as needed for pain. 06/25/18  Yes [provider]  B Complex-C (B-COMPLEX WITH VITAMIN C) tablet Take 1 tablet by mouth daily at 12 noon.   Yes [provider]  Cholecalciferol (VITAMIN D3) 125 MCG (5000 UT) CAPS Take 5,000 Units by mouth daily at 12 noon.   Yes [provider]  famotidine (PEPCID) 20 MG tablet Take 20 mg by mouth 2 (two) times daily as needed for indigestion or heartburn. 12/06/20  Yes [provider]  losartan (COZAAR) 50 MG tablet Take 50 mg by mouth in the morning.   Yes [provider]  Multiple Vitamin (MULTIVITAMIN WITH MINERALS) TABS tablet Take 1  tablet by mouth daily at 12 noon.   Yes [provider]  oxycodone (OXY-IR) 5 MG capsule Take 5 mg by mouth every 4 (four) hours as needed for pain.   Yes [provider]  rosuvastatin (CRESTOR) 5 MG tablet Take 5 mg by mouth every evening. 11/24/20  Yes [provider]  budesonide (ENTOCORT EC) 3 MG 24 hr capsule Take 9 mg by mouth daily as needed (diarrhea/loose stools). 06/06/19   [provider]  diclofenac Sodium (VOLTAREN) 1 % GEL Apply 1 application topically 4 (four) times daily as needed (pain.).    [provider]   No results found.  Positive ROS: All other systems have been reviewed and were otherwise negative with the exception of those mentioned in the HPI and as  above.  Physical Exam: General: Alert, no acute distress Cardiovascular: No pedal edema Respiratory: No cyanosis, no use of accessory musculature GI: No organomegaly, abdomen is soft and non-tender Skin: No lesions in the area of chief complaint Neurologic: Sensation intact distally Psychiatric: Patient is competent for consent with normal mood and affect Lymphatic: No axillary or cervical lymphadenopathy  MUSCULOSKELETAL:  Right upper extremity is warm and well-perfused.  Bruising noted in the axilla down to the elbow.  Distally neurovascular intact.  No open wounds.  Assessment: Four-part proximal humerus fracture, right.  Plan: -[name] and I again discussed her injury and treatment options.  She is unfortunately indicated for surgical management given the head splitting involvement as well as displacement of the tuberosities.  We discussed reverse arthroplasty as definitive treatment.  We discussed the risk of bleeding, infection, damage to surrounding nerves and vessels, stiffness, dislocation, fracture, need for revision surgery as well as the risk of anesthesia.  She has provided informed consent.  -We will plan to have her either discharge home from PACU if she is doing very well with good pain control and stable vital signs versus stay overnight for observation.    Amber Stairs, MD Cell [phone]    06/27/2021 5:18 PM

## 2021-06-27 NOTE — Anesthesia Procedure Notes (Signed)
Anesthesia Regional Block: Interscalene brachial plexus block   Pre-Anesthetic Checklist: , timeout performed,  Correct Patient, Correct Site, Correct Laterality,  Correct Procedure, Correct Position, site marked,  Risks and benefits discussed,  Surgical consent,  Pre-op evaluation,  At surgeon's request and post-op pain management  Laterality: Right  Prep: chloraprep       Needles:  Injection technique: Single-shot  Needle Type: Echogenic Stimulator Needle     Needle Length: 5cm  Needle Gauge: 22     Additional Needles:   Narrative:  Start time: 06/27/2021 4:17 PM End time: 06/27/2021 4:27 PM Injection made incrementally with aspirations every 5 mL.  Performed by: Personally  Anesthesiologist: Duane Boston, MD  Additional Notes: Functioning IV was confirmed and monitors applied.  A 73mm 22ga echogenic arrow stimulator was used. Sterile prep and drape,hand hygiene and sterile gloves were used.Ultrasound guidance: relevant anatomy identified, needle position confirmed, local anesthetic spread visualized around nerve(s)., vascular puncture avoided.  Image printed for medical record.  Negative aspiration and negative test dose prior to incremental administration of local anesthetic. The patient tolerated the procedure well.

## 2021-06-27 NOTE — Anesthesia Preprocedure Evaluation (Addendum)
Anesthesia Evaluation  Patient identified by MRN, date of birth, ID band Patient awake    Reviewed: Allergy & Precautions, NPO status , Patient's Chart, lab work & pertinent test results  History of Anesthesia Complications Negative for: history of anesthetic complications  Airway Mallampati: II  TM Distance: >3 FB Neck ROM: Full    Dental no notable dental hx. (+) Dental Advisory Given   Pulmonary neg pulmonary ROS, former smoker,    Pulmonary exam normal        Cardiovascular hypertension, Pt. on medications Normal cardiovascular exam     Neuro/Psych negative neurological ROS     GI/Hepatic Neg liver ROS, GERD  Medicated,  Endo/Other  negative endocrine ROS  Renal/GU negative Renal ROS     Musculoskeletal negative musculoskeletal ROS (+)   Abdominal   Peds  Hematology negative hematology ROS (+)   Anesthesia Other Findings   Reproductive/Obstetrics                            Anesthesia Physical Anesthesia Plan  ASA: 2  Anesthesia Plan: General   Post-op Pain Management:  Regional for Post-op pain   Induction: Intravenous  PONV Risk Score and Plan: 3 and Ondansetron, Dexamethasone and Diphenhydramine  Airway Management Planned: Oral ETT  Additional Equipment:   Intra-op Plan:   Post-operative Plan: Extubation in OR  Informed Consent: I have reviewed the patients History and Physical, chart, labs and discussed the procedure including the risks, benefits and alternatives for the proposed anesthesia with the patient or authorized representative who has indicated his/her understanding and acceptance.     Dental advisory given  Plan Discussed with: Anesthesiologist and CRNA  Anesthesia Plan Comments:        Anesthesia Quick Evaluation

## 2021-06-27 NOTE — Op Note (Signed)
Date: 06/27/2021   PRE-OPERATIVE DIAGNOSIS:  Right four-part proximal humerus fracture   POST-OPERATIVE DIAGNOSIS:  Same   PROCEDURE:  1. Right REVERSE SHOULDER ARTHROPLASTY    SURGEON:  Nicholes Stairs, MD   ASSISTANT: Jonelle Sidle, PA-C  Assistant attestation:  PA Mcclung present for the entire procedure.  Participated in all critical portions including prepping, draping, implantation and closure.   ANESTHESIA:   General with a block   ESTIMATED BLOOD LOSS: See anesthesia record   PREOPERATIVE INDICATIONS: Amber Morse is a right-hand-dominant 75 year old female who sustained a ground-level fall with comminuted and four-part with head splitting component proximal humerus fracture following a fall.  Due to the comminution and displaced nature of the fracture and head splitting components we discussed operative management.  We discussed moving forward with reverse shoulder arthroplasty for his injury to allow earlier weight bearing. Thus we elected to proceed with reverse shoulder arthroplasty for the plan.  The risks benefits and alternatives were discussed with the patient preoperatively including but not limited to the risks of infection, bleeding, nerve injury, cardiopulmonary complications, the need for revision surgery, dislocation, brachial plexus palsy, incomplete relief of pain, among others, and the patient was willing to proceed. The patient did provided informed consent.   OPERATIVE IMPLANTS: Arthrex reverse arthroplasty with size 9 standard stem Standard humeral tray with a +6 polyethylene on the humeral side 24 mm +2 glenoid baseplate with a 30 mm central compression screw and 4 peripheral locking screws 36 mm +4 mm lateralized glenosphere  OPERATIVE FINDINGS: Comminuted and head splitting proximal humerus fracture in 4 parts.  Significant greater tuberosity comminution as well as fragmentation of the humeral head.  Minimal calcar bone loss.   OPERATIVE PROCEDURE:  The patient was brought to the operating room and placed in the supine position. General anesthesia was administered. IV antibiotics were given. Time out was performed. The upper extremity was prepped and draped in usual sterile fashion. The patient was in a beachchair position. Deltopectoral approach was carried out. After dissection through skin and subcutaneous fat, the cephalic vein was identified with the deltopectoral interval.  This was mobilized and taken laterally.   The fracture was identified and working through the fracture in the biceps groove the lesser and greater tuberosities were freed up and tagged with Arthrex suture tape sutures.  The humeral head was fragmented and removed from the wound.     Next, the long head of the biceps tendon was released.   I then performed circumferential releases of the humerus.  I then moved to sizing the humerus.  There was minimal calcar bone loss and this was used to reference the height of the stem, as was the upper border of the pectoralis major tendon.  The canal was reamed and found to fit best with a 9 mm fracture stem.   We next turned to the glenoid.  Deep retractors were placed, and I resected the labrum as well as the residual long head of biceps, and then placed a guidepin into the center position on the glenoid, with slight inferior declination. I then reamed over the guidepin, and this created a small metaphyseal cancellus blush inferiorly, removing just the cartilage to the subchondral bone superiorly. The base plate was selected and impacted place, and then I secured it centrally with a nonlocking screw, and I had excellent purchase both inferiorly and superiorly. I placed a short locking screws on anterior aspect and posterior aspect.   I then turned my attention to the  glenosphere, and impacted this into place.    The glenoid sphere was completely seated, and had engagement of the [facility] taper and central set screw. I then turned my  attention back to the humerus.    The 9 mm fracture stem was seated to the appropriate height to allow approximate 5.6 cm from the top of the Pectoralis major tendon to the top of the glenosphere.  The stem was placed in 30 degrees of retroversion.   Once the stem was impacted into place we trialed poly liners.  Using the standard humeral metaglen,  The shoulder had excellent motion, and was stable.  The final poly, a +6 mm,  was impacted and again showed good motion and stability.  Next, I irrigated the wounds copiously.    The greater tuberosity was brought back to the humeral stem suture holes and secured with bone graft from the humeral head as augment.  The lesser tuberosity was also brought back to the stem, and the deltoid was noted to have excellent tension.  The axillary nerve was palpated at the end of implanting, and found to be in continuity and not under undo tension.   I then irrigated the shoulder copiously once more, applied vancomycin powder, repaired the deltopectoral interval with Vicryl followed by subcutaneous monocryl and then subcuticular monocryl with Steri-Strips and sterile gauze for the skin. The patient was awakened and returned back in stable and satisfactory condition. There no complications and he tolerated the procedure well.  All counts were correct.   The patient awakened from general anesthesia with no complications and transferred to PACU in stable condition.   Postoperative Plan: Amber Morse will remain in his sling at all times unless pendulums and scapular retraction and ADLs for the first 2 weeeks.  The sling will be worn for sleep for 6 weeks.  We will monitor her in the PACU, monitor her pain.  As long as this is well controlled and breathing is nonlabored or hypoxic due to the interscalene block, we will allow her to discharge home today.  She will follow-up with me in 2 weeks with 2 views of the right shoulder, AP and scapular Y.

## 2021-06-27 NOTE — Transfer of Care (Signed)
Immediate Anesthesia Transfer of Care Note  Patient: Amber Morse  Procedure(s) Performed: REVERSE SHOULDER ARTHROPLASTY (Right: Shoulder)  Patient Location: PACU  Anesthesia Type:GA combined with regional for post-op pain  Level of Consciousness: awake  Airway & Oxygen Therapy: Patient Spontanous Breathing  Post-op Assessment: Report given to RN and Post -op Vital signs reviewed and stable  Post vital signs: Reviewed and stable  Last Vitals:  Vitals Value Taken Time  BP 116/60 06/27/21 2034  Temp 37.1 C 06/27/21 2005  Pulse 100 06/27/21 2040  Resp 22 06/27/21 2040  SpO2 95 % 06/27/21 2040  Vitals shown include unvalidated device data.  Last Pain:  Vitals:   06/27/21 2015  TempSrc:   PainSc: 0-No pain         Complications: No notable events documented.

## 2021-06-27 NOTE — Discharge Instructions (Addendum)
Orthopedic surgery discharge instructions:  -Maintain postoperative bandage until follow-up appointment.  This is waterproof, and you may begin showering on postoperative day #3.  Do not submerge underwater.  Maintain that bandage until your follow-up appointment in 2 weeks.  -No lifting over 2 pounds with operateive arm.  You may use the arm immediately for bathing and asssisting with eating.  Otherwise maintain your sling.  -Apply ice liberally to the shoulder throughout the day.  For mild to moderate pain use Tylenol and Advil as needed around-the-clock.  For breakthrough pain use oxycodone as necessary.  You may take between 5 and 10 mg of oxycodone in each dose.  -You will return to see Dr. Stann Mainland in the office in 2 weeks for routine postoperative check with x-rays.

## 2021-06-28 ENCOUNTER — Encounter (HOSPITAL_COMMUNITY): Payer: Self-pay | Admitting: Orthopedic Surgery

## 2021-06-28 DIAGNOSIS — Z79899 Other long term (current) drug therapy: Secondary | ICD-10-CM | POA: Diagnosis not present

## 2021-06-28 DIAGNOSIS — I10 Essential (primary) hypertension: Secondary | ICD-10-CM | POA: Diagnosis not present

## 2021-06-28 DIAGNOSIS — Z87891 Personal history of nicotine dependence: Secondary | ICD-10-CM | POA: Diagnosis not present

## 2021-06-28 DIAGNOSIS — M6281 Muscle weakness (generalized): Secondary | ICD-10-CM | POA: Diagnosis not present

## 2021-06-28 DIAGNOSIS — S42241A 4-part fracture of surgical neck of right humerus, initial encounter for closed fracture: Secondary | ICD-10-CM | POA: Diagnosis not present

## 2021-06-28 DIAGNOSIS — Z85048 Personal history of other malignant neoplasm of rectum, rectosigmoid junction, and anus: Secondary | ICD-10-CM | POA: Diagnosis not present

## 2021-06-28 DIAGNOSIS — Z20822 Contact with and (suspected) exposure to covid-19: Secondary | ICD-10-CM | POA: Diagnosis not present

## 2021-06-28 LAB — HEMOGLOBIN AND HEMATOCRIT, BLOOD
HCT: 25.3 % — ABNORMAL LOW (ref 36.0–46.0)
Hemoglobin: 8.2 g/dL — ABNORMAL LOW (ref 12.0–15.0)

## 2021-06-28 NOTE — Evaluation (Addendum)
Occupational Therapy Evaluation/Discharge Patient Details Name: Amber Morse MRN: [id number] DOB: 04-20-1946 Today's Date: 06/28/2021   History of Present Illness Pt is a 76 y/o female who sustained a ground-level fall with comminuted and four-part with head splitting component proximal humerus fracture following a fall. Pt underwent R reverse shoulder arthroplasty on 11/3 per surgeon's recommendation. PMH: rectal cancer, GERD, HTN   Clinical Impression   PTA, pt lives alone and reports complete Independence in all daily tasks. Pt presents now s/p procedure above impacting R dominant UE. Despite nerve block still in effect, pt able to return demo UB/LB dressing, sling mgmt and mobility in room without assistance. Pt did require assistance to open small containers for coffee prep but anticipate once R UE sensation returns, pt will be able to manage these tasks as well. Education completed re: AROM of hand/wrist/elbow, WB precautions, safe positioning of shoulder during ADLs, use of ice, compensatory strategies for med mgmt and other IADLs in the home. Pt verbalized understanding of all education with no further acute OT needs. Recommend following surgeon's plan for therapy follow-up. Pt motivated to return to complete independence.      Recommendations for follow up therapy are one component of a multi-disciplinary discharge planning process, led by the attending physician.  Recommendations may be updated based on patient status, additional functional criteria and insurance authorization.   Follow Up Recommendations  Follow physician's recommendations for discharge plan and follow up therapies    Assistance Recommended at Discharge PRN (IADL assist initially)  Functional Status Assessment  Patient has had a recent decline in their functional status and demonstrates the ability to make significant improvements in function in a reasonable and predictable amount of time.  Equipment  Recommendations  None recommended by OT    Recommendations for Other Services       Precautions / Restrictions Precautions Precautions: Fall;Shoulder Type of Shoulder Precautions: reverse Shoulder Interventions: Shoulder sling/immobilizer;Off for dressing/bathing/exercises Precaution Booklet Issued: Yes (comment) Precaution Comments: no P/AROM of R shoulder; ok for AROM of hand, wrist and elbow. Restrictions Weight Bearing Restrictions: Yes RUE Weight Bearing: Non weight bearing      Mobility Bed Mobility Overal bed mobility: Modified Independent             General bed mobility comments: Cued for protection of R shoulder,    Transfers Overall transfer level: Independent Equipment used: None                      Balance Overall balance assessment: No apparent balance deficits (not formally assessed)                                         ADL either performed or assessed with clinical judgement   ADL Overall ADL's : Modified independent                                       General ADL Comments: Despite nerve block, pt able to demo use of L nondominant hand to complete UB/LB dressing, sling mgmt and mobility without assistance. Pt able to use L hand to button shirt with increased time. Educated on strategies for picking items from floor safely, use of reacher as needed, assist with IADLs if needed. did require assist to prepare coffee due to inability  to use R hand due to nerve block. anticipate pt able to do this once sensation returns     Vision Baseline Vision/History: 1 Wears glasses Ability to See in Adequate Light: 0 Adequate Patient Visual Report: No change from baseline Vision Assessment?: No apparent visual deficits     Perception     Praxis      Pertinent Vitals/Pain Pain Assessment: No/denies pain (nerve block still in effect)     Hand Dominance Right   Extremity/Trunk Assessment Upper Extremity  Assessment Upper Extremity Assessment: RUE deficits/detail RUE Deficits / Details: edematous, nerve block still in effect, able to wiggle fingers and slight wrist movement, no active elbow movement yet RUE: Unable to fully assess due to immobilization RUE Sensation: decreased light touch;decreased proprioception RUE Coordination: decreased fine motor;decreased gross motor   Lower Extremity Assessment Lower Extremity Assessment: Overall WFL for tasks assessed   Cervical / Trunk Assessment Cervical / Trunk Assessment: Normal   Communication Communication Communication: No difficulties   Cognition Arousal/Alertness: Awake/alert Behavior During Therapy: WFL for tasks assessed/performed Overall Cognitive Status: Within Functional Limits for tasks assessed                                       General Comments       Exercises Exercises: Shoulder   Shoulder Instructions Shoulder Instructions Donning/doffing shirt without moving shoulder: Modified independent Method for sponge bathing under operated UE: Modified independent Donning/doffing sling/immobilizer: Modified independent Correct positioning of sling/immobilizer: Modified independent Sling wearing schedule (on at all times/off for ADL's): Modified independent Proper positioning of operated UE when showering: Modified independent Positioning of UE while sleeping: Modified independent    Home Living Family/patient expects to be discharged to:: Private residence Living Arrangements: Alone Available Help at Discharge: Family;Available PRN/intermittently Type of Home: House Home Access: Stairs to enter [employer] of Steps: 2-3 at front and back Entrance Stairs-Rails: Right;Left Home Layout: One level     Bathroom Shower/Tub: Occupational psychologist: Handicapped height     Home Equipment: Shower seat          Prior Functioning/Environment Prior Level of Function :  Independent/Modified Independent;Driving             Mobility Comments: no use of AD, no hx of falls other than the one resulting in fx ADLs Comments: Independent with ADLs, IADLs, driving (drives grandchildren to/from school). Very active, independent        OT Problem List: Decreased strength;Decreased range of motion;Decreased coordination;Impaired UE functional use;Impaired sensation      OT Treatment/Interventions:      OT Goals(Current goals can be found in the care plan section) Acute Rehab OT Goals Patient Stated Goal: maintain independence OT Goal Formulation: All assessment and education complete, DC therapy  OT Frequency:     Barriers to D/C:            Co-evaluation              AM-PAC OT "6 Clicks" Daily Activity     Outcome Measure Help from another person eating meals?: A Little Help from another person taking care of personal grooming?: None Help from another person toileting, which includes using toliet, bedpan, or urinal?: None Help from another person bathing (including washing, rinsing, drying)?: None Help from another person to put on and taking off regular upper body clothing?: None Help from another person to put  on and taking off regular lower body clothing?: None 6 Click Score: 23   End of Session Nurse Communication: Mobility status;Other (comment) (IV mgmt)  Activity Tolerance: Patient tolerated treatment well Patient left: in bed;with call bell/phone within reach  OT Visit Diagnosis: Muscle weakness (generalized) (M62.81)                Time: 1610-9604 OT Time Calculation (min): 45 min Charges:  OT General Charges $OT Visit: 1 Visit OT Evaluation $OT Eval Low Complexity: 1 Low OT Treatments $Self Care/Home Management : 8-22 mins $Therapeutic Activity: 8-22 mins  Malachy Chamber, OTR/L Acute Rehab Services Office: [phone]   Layla Maw 06/28/2021, 7:58 AM

## 2021-06-28 NOTE — Plan of Care (Signed)
Pt taught discharge education, belongings all gathered and pt iv removed

## 2021-06-30 NOTE — Discharge Summary (Signed)
Patient ID: Amber Morse MRN: [id number] DOB/AGE: Jan 23, 1946 75 y.o.  Admit date: 06/27/2021 Discharge date: 06/28/2021  Primary Diagnosis: Right comminuted proximal humerus fracture, four-part Admission Diagnoses:  Past Medical History:  Diagnosis Date   Arthritis    Cancer ([place])    rectal   GERD (gastroesophageal reflux disease)    Hematuria    (W/u  neg 2011)   History of kidney stones    Hypertension    Palpitation    Discharge Diagnoses:   Active Problems:   S/P reverse total shoulder arthroplasty, right  Estimated body mass index is 35.95 kg/m as calculated from the following:   Height as of this encounter: 4\' 11"  (1.499 m).   Weight as of this encounter: 80.7 kg.  Procedure:  Procedure(s) (LRB): REVERSE SHOULDER ARTHROPLASTY (Right)   Consults: None  HPI: Amber Morse was admitted for management of her right proximal humerus fracture.  She sustained a fall that resulted in a four-part fracture with displacement.  She was indicated for reverse arthroplasty and admission for observation postoperatively. Laboratory Data: Admission on 06/27/2021, Discharged on 06/28/2021  Component Date Value Ref Range Status   WBC 06/27/2021 5.4  4.0 - 10.5 K/uL Final   RBC 06/27/2021 3.01 (A)  3.87 - 5.11 MIL/uL Final   Hemoglobin 06/27/2021 9.6 (A)  12.0 - 15.0 g/dL Final   HCT 06/27/2021 28.4 (A)  36.0 - 46.0 % Final   MCV 06/27/2021 94.4  80.0 - 100.0 fL Final   MCH 06/27/2021 31.9  26.0 - 34.0 pg Final   MCHC 06/27/2021 33.8  30.0 - 36.0 g/dL Final   RDW 06/27/2021 12.8  11.5 - 15.5 % Final   Platelets 06/27/2021 296  150 - 400 K/uL Final   nRBC 06/27/2021 0.0  0.0 - 0.2 % Final   Performed at [facility], [place] [address], [place], [place] [address]   MRSA, PCR 06/27/2021 NEGATIVE  NEGATIVE Final   Staphylococcus aureus 06/27/2021 NEGATIVE  NEGATIVE Final   Comment: (NOTE) The Xpert SA Assay (FDA approved for NASAL specimens in patients 75 years of age and  older), is one component of a comprehensive surveillance program. It is not intended to diagnose infection nor to guide or monitor treatment. Performed at [facility], [place] [address] [place], [place] [address]    SARS Coronavirus 2 06/27/2021 NEGATIVE  NEGATIVE Final   Comment: (NOTE) SARS-CoV-2 target nucleic acids are NOT DETECTED.  The SARS-CoV-2 RNA is generally detectable in upper and lower respiratory specimens during the acute phase of infection. The lowest concentration of SARS-CoV-2 viral copies this assay can detect is 250 copies / mL. A negative result does not preclude SARS-CoV-2 infection and should not be used as the sole basis for treatment or other patient management decisions.  A negative result may occur with improper specimen collection / handling, submission of specimen other than nasopharyngeal swab, presence of viral mutation(s) within the areas targeted by this assay, and inadequate number of viral copies (<250 copies / mL). A negative result must be combined with clinical observations, patient history, and epidemiological information.  Fact Sheet for Patients:   StrictlyIdeas.no  Fact Sheet for Healthcare Providers: BankingDealers.co.za  This test is not yet approved or                           cleared by the [place] FDA and has been authorized for detection and/or diagnosis of SARS-CoV-2 by FDA  under an Emergency Use Authorization (EUA).  This EUA will remain in effect (meaning this test can be used) for the duration of the COVID-19 declaration under Section 564(b)(1) of the Act, 21 U.S.C. section 360bbb-3(b)(1), unless the authorization is terminated or revoked sooner.  Performed at [facility], [place] [address] [place], [place] [address]    Sodium 06/27/2021 137  135 - 145 mmol/L Final   Potassium 06/27/2021 3.9  3.5 - 5.1 mmol/L Final   Chloride 06/27/2021 105  98 - 111 mmol/L  Final   CO2 06/27/2021 22  22 - 32 mmol/L Final   Glucose, Bld 06/27/2021 98  70 - 99 mg/dL Final   Glucose reference range applies only to samples taken after fasting for at least 8 hours.   BUN 06/27/2021 17  8 - 23 mg/dL Final   Creatinine, Ser 06/27/2021 0.99  0.44 - 1.00 mg/dL Final   Calcium 06/27/2021 9.8  8.9 - 10.3 mg/dL Final   GFR, Estimated 06/27/2021 60 (A)  >60 mL/min Final   Comment: (NOTE) Calculated using the CKD-EPI Creatinine Equation (2021)    Anion gap 06/27/2021 10  5 - 15 Final   Performed at [facility], [place] [address], [place], [place] [address]   Hemoglobin 06/28/2021 8.2 (A)  12.0 - 15.0 g/dL Final   HCT 06/28/2021 25.3 (A)  36.0 - 46.0 % Final   Performed at [facility], [place] [address] [place], [place] [address]  Office Visit on 05/09/2021  Component Date Value Ref Range Status   CEA ([facility]) 05/09/2021 1.45  0.00 - 5.00 ng/mL Final   Comment: (NOTE) This test was performed using Beck[employer] Coulter's paramagnetic chemiluminescent immunoassay. Values obtained from different assay methods cannot be used interchangeably. Please note that up to 8% of patients who smoke may see values 5.1-10.0 ng/ml and 1% of patients who smoke may see CEA levels >10.0 ng/ml. Performed at [facility], [address], [place], [place] [address]   Appointment on 05/09/2021  Component Date Value Ref Range Status   CEA ([facility]In House) 05/09/2021 1.08  0.00 - 5.00 ng/mL Final   Comment: (NOTE) This test was performed using Architect's Chemiluminescent Microparticle Immunoassay. Values obtained from different assay methods cannot be used interchangeably. Please note that 5-10% of patients who smoke may see CEA levels up to 6.9 ng/mL. Performed at [facility], [place] [address], [place], [place] [address]      X-Rays:DG Shoulder Right Port  Result Date: 06/27/2021 CLINICAL DATA:  Postoperative following right shoulder  arthroplasty EXAM: PORTABLE RIGHT SHOULDER COMPARISON:  Preoperative right shoulder series 06/20/2021 FINDINGS: There is interval resection of the right humeral head and proximal neck and interval placement of a right shoulder reverse arthroplasty. Only one view was submitted. The hardware appears normally positioned on this single view. There is no evidence of periprosthetic fracture. The bone mineralization is osteopenic. The [facility] joint is intact with spurring changes. The visualized portion of the right lung is clear. Surrounding soft tissues show scattered air inclusions. IMPRESSION: Reverse right shoulder arthroplasty. No acute hardware complication is seen. Osteopenia. Electronically Signed   By: Telford Nab M.D.   On: 06/27/2021 20:45    EKG: Orders placed or performed during the hospital encounter of 06/27/21   EKG 12-Lead   EKG 12-Lead     Hospital Course: Amber Morse is a 76 y.o. who was admitted to Hospital. They were brought to the operating room on 06/27/2021  and underwent Procedure(s): REVERSE SHOULDER ARTHROPLASTY.  Patient tolerated the procedure well and was later transferred to the recovery room and then to the orthopaedic floor for postoperative care.  They were given PO and IV analgesics for pain control following their surgery.  They were given 24 hours of postoperative antibiotics of  Anti-infectives (From admission, onward)    Start     Dose/Rate Route Frequency Ordered Stop   06/28/21 0000  ceFAZolin (ANCEF) IVPB 1 g/50 mL premix  Status:  Discontinued        1 g 100 mL/hr over 30 Minutes Intravenous Every 6 hours 06/27/21 2125 06/28/21 1608   06/27/21 1832  vancomycin (VANCOCIN) powder  Status:  Discontinued          As needed 06/27/21 1832 06/27/21 2004   06/27/21 1615  ceFAZolin (ANCEF) IVPB 2g/100 mL premix        2 g 200 mL/hr over 30 Minutes Intravenous On call to O.R. 06/27/21 1403 06/27/21 1805   06/27/21 1405  ceFAZolin (ANCEF) 2-4 GM/100ML-% IVPB        Note to Pharmacy: Renda Rolls   : cabinet override      06/27/21 1405 06/27/21 1846      and started on DVT prophylaxis in the form of Aspirin.     Dressing was changed on day two and the incision was clean dry and intact.  By day three, the patient had progressed with therapy and meeting their goals.  Incision was healing well.  Patient was seen in rounds and was ready to go home.   Diet: Regular diet Activity:NWB Follow-up:in 2 weeks Disposition - Home Discharged Condition: good   Discharge Instructions     Call MD / Call 911   Complete by: As directed    If you experience chest pain or shortness of breath, CALL 911 and be transported to the hospital emergency room.  If you develope a fever above 101 F, pus (white drainage) or increased drainage or redness at the wound, or calf pain, call your surgeon's office.   Constipation Prevention   Complete by: As directed    Drink plenty of fluids.  Prune juice may be helpful.  You may use a stool softener, such as Colace (over the counter) 100 mg twice a day.  Use MiraLax (over the counter) for constipation as needed.   Diet - low sodium heart healthy   Complete by: As directed    Increase activity slowly as tolerated   Complete by: As directed    Post-operative opioid taper instructions:   Complete by: As directed    POST-OPERATIVE OPIOID TAPER INSTRUCTIONS: It is important to wean off of your opioid medication as soon as possible. If you do not need pain medication after your surgery it is ok to stop day one. Opioids include: Codeine, Hydrocodone(Norco, Vicodin), Oxycodone(Percocet, oxycontin) and hydromorphone amongst others.  Long term and even short term use of opiods can cause: Increased pain response Dependence Constipation Depression Respiratory depression And more.  Withdrawal symptoms can include Flu like symptoms Nausea, vomiting And more Techniques to manage these symptoms Hydrate well Eat regular healthy  meals Stay active Use relaxation techniques(deep breathing, meditating, yoga) Do Not substitute Alcohol to help with tapering If you have been on opioids for less than two weeks and do not have pain than it is ok to stop all together.  Plan to wean off of opioids This plan should start within one week post op of your joint replacement.  Maintain the same interval or time between taking each dose and first decrease the dose.  Cut the total daily intake of opioids by one tablet each day Next start to increase the time between doses. The last dose that should be eliminated is the evening dose.         Allergies as of 06/28/2021       Reactions   Lisinopril Cough        Medication List     TAKE these medications    acetaminophen 500 MG tablet Commonly known as: TYLENOL Take 500 mg by mouth every 6 (six) hours as needed.   acetaminophen 650 MG CR tablet Commonly known as: TYLENOL Take 650 mg by mouth every 8 (eight) hours as needed for pain.   B-complex with vitamin C tablet Take 1 tablet by mouth daily at 12 noon.   budesonide 3 MG 24 hr capsule Commonly known as: ENTOCORT EC Take 9 mg by mouth daily as needed (diarrhea/loose stools).   diclofenac Sodium 1 % Gel Commonly known as: VOLTAREN Apply 1 application topically 4 (four) times daily as needed (pain.).   famotidine 20 MG tablet Commonly known as: PEPCID Take 20 mg by mouth 2 (two) times daily as needed for indigestion or heartburn.   losartan 50 MG tablet Commonly known as: COZAAR Take 50 mg by mouth in the morning.   multivitamin with minerals Tabs tablet Take 1 tablet by mouth daily at 12 noon.   ondansetron 4 MG disintegrating tablet Commonly known as: Zofran ODT Take 1 tablet (4 mg total) by mouth every 8 (eight) hours as needed for nausea.   oxycodone 5 MG capsule Commonly known as: OXY-IR Take 1-2 capsules (5-10 mg total) by mouth every 4 (four) hours as needed for pain. What changed: how much  to take   rosuvastatin 5 MG tablet Commonly known as: CRESTOR Take 5 mg by mouth every evening.   Vitamin D3 125 MCG (5000 UT) Caps Take 5,000 Units by mouth daily at 12 noon.        Follow-up Information     Nicholes Stairs, MD Follow up in 2 week(s).   Specialty: Orthopedic Surgery Why: For wound re-check Contact information: [address] [place] [address] [phone]                 Signed: Geralynn Rile, MD Orthopaedic Surgery 06/30/2021, 11:33 AM

## 2021-07-01 NOTE — Anesthesia Postprocedure Evaluation (Signed)
Anesthesia Post Note  Patient: NKECHI LINEHAN  Procedure(s) Performed: REVERSE SHOULDER ARTHROPLASTY (Right: Shoulder)     Patient location during evaluation: PACU Anesthesia Type: General Level of consciousness: awake and alert Pain management: pain level controlled Vital Signs Assessment: post-procedure vital signs reviewed and stable Respiratory status: spontaneous breathing, nonlabored ventilation, respiratory function stable and patient connected to nasal cannula oxygen Cardiovascular status: blood pressure returned to baseline and stable Postop Assessment: no apparent nausea or vomiting Anesthetic complications: no   No notable events documented.  Last Vitals:  Vitals:   06/28/21 0649 06/28/21 0748  BP: (!) 94/53 106/67  Pulse: 100 89  Resp: 19 17  Temp: 36.5 C 36.7 C  SpO2: 96% 94%    Last Pain:  Vitals:   06/28/21 0748  TempSrc: Oral  PainSc: 0-No pain                 Catalina Gravel

## 2021-07-12 DIAGNOSIS — Z4789 Encounter for other orthopedic aftercare: Secondary | ICD-10-CM | POA: Diagnosis not present

## 2021-07-12 DIAGNOSIS — M79631 Pain in right forearm: Secondary | ICD-10-CM | POA: Diagnosis not present

## 2021-07-24 DIAGNOSIS — Z4789 Encounter for other orthopedic aftercare: Secondary | ICD-10-CM | POA: Diagnosis not present

## 2021-07-24 DIAGNOSIS — M25511 Pain in right shoulder: Secondary | ICD-10-CM | POA: Diagnosis not present

## 2021-07-24 DIAGNOSIS — Z96611 Presence of right artificial shoulder joint: Secondary | ICD-10-CM | POA: Diagnosis not present

## 2021-07-29 DIAGNOSIS — Z4789 Encounter for other orthopedic aftercare: Secondary | ICD-10-CM | POA: Diagnosis not present

## 2021-07-29 DIAGNOSIS — M25511 Pain in right shoulder: Secondary | ICD-10-CM | POA: Diagnosis not present

## 2021-07-29 DIAGNOSIS — Z96611 Presence of right artificial shoulder joint: Secondary | ICD-10-CM | POA: Diagnosis not present

## 2021-08-01 DIAGNOSIS — Z96611 Presence of right artificial shoulder joint: Secondary | ICD-10-CM | POA: Diagnosis not present

## 2021-08-01 DIAGNOSIS — Z4789 Encounter for other orthopedic aftercare: Secondary | ICD-10-CM | POA: Diagnosis not present

## 2021-08-01 DIAGNOSIS — M25511 Pain in right shoulder: Secondary | ICD-10-CM | POA: Diagnosis not present

## 2021-08-05 DIAGNOSIS — Z4789 Encounter for other orthopedic aftercare: Secondary | ICD-10-CM | POA: Diagnosis not present

## 2021-08-05 DIAGNOSIS — M25511 Pain in right shoulder: Secondary | ICD-10-CM | POA: Diagnosis not present

## 2021-08-05 DIAGNOSIS — Z96611 Presence of right artificial shoulder joint: Secondary | ICD-10-CM | POA: Diagnosis not present

## 2021-08-08 DIAGNOSIS — M25511 Pain in right shoulder: Secondary | ICD-10-CM | POA: Diagnosis not present

## 2021-08-08 DIAGNOSIS — Z96611 Presence of right artificial shoulder joint: Secondary | ICD-10-CM | POA: Diagnosis not present

## 2021-08-08 DIAGNOSIS — Z4789 Encounter for other orthopedic aftercare: Secondary | ICD-10-CM | POA: Diagnosis not present

## 2021-08-09 DIAGNOSIS — Z4789 Encounter for other orthopedic aftercare: Secondary | ICD-10-CM | POA: Diagnosis not present

## 2021-08-12 DIAGNOSIS — Z96611 Presence of right artificial shoulder joint: Secondary | ICD-10-CM | POA: Diagnosis not present

## 2021-08-12 DIAGNOSIS — Z4789 Encounter for other orthopedic aftercare: Secondary | ICD-10-CM | POA: Diagnosis not present

## 2021-08-12 DIAGNOSIS — M25511 Pain in right shoulder: Secondary | ICD-10-CM | POA: Diagnosis not present

## 2021-08-15 DIAGNOSIS — M25511 Pain in right shoulder: Secondary | ICD-10-CM | POA: Diagnosis not present

## 2021-08-15 DIAGNOSIS — Z96611 Presence of right artificial shoulder joint: Secondary | ICD-10-CM | POA: Diagnosis not present

## 2021-08-15 DIAGNOSIS — Z4789 Encounter for other orthopedic aftercare: Secondary | ICD-10-CM | POA: Diagnosis not present

## 2021-08-21 DIAGNOSIS — Z96611 Presence of right artificial shoulder joint: Secondary | ICD-10-CM | POA: Diagnosis not present

## 2021-08-21 DIAGNOSIS — Z4789 Encounter for other orthopedic aftercare: Secondary | ICD-10-CM | POA: Diagnosis not present

## 2021-08-21 DIAGNOSIS — M25511 Pain in right shoulder: Secondary | ICD-10-CM | POA: Diagnosis not present

## 2021-08-22 DIAGNOSIS — M25531 Pain in right wrist: Secondary | ICD-10-CM | POA: Diagnosis not present

## 2021-08-23 DIAGNOSIS — Z96611 Presence of right artificial shoulder joint: Secondary | ICD-10-CM | POA: Diagnosis not present

## 2021-08-23 DIAGNOSIS — Z4789 Encounter for other orthopedic aftercare: Secondary | ICD-10-CM | POA: Diagnosis not present

## 2021-08-23 DIAGNOSIS — M25511 Pain in right shoulder: Secondary | ICD-10-CM | POA: Diagnosis not present

## 2021-08-27 DIAGNOSIS — Z1331 Encounter for screening for depression: Secondary | ICD-10-CM | POA: Diagnosis not present

## 2021-08-27 DIAGNOSIS — Z1339 Encounter for screening examination for other mental health and behavioral disorders: Secondary | ICD-10-CM | POA: Diagnosis not present

## 2021-08-27 DIAGNOSIS — Z0000 Encounter for general adult medical examination without abnormal findings: Secondary | ICD-10-CM | POA: Diagnosis not present

## 2021-08-27 DIAGNOSIS — Z4789 Encounter for other orthopedic aftercare: Secondary | ICD-10-CM | POA: Diagnosis not present

## 2021-08-27 DIAGNOSIS — M25511 Pain in right shoulder: Secondary | ICD-10-CM | POA: Diagnosis not present

## 2021-08-27 DIAGNOSIS — Z96611 Presence of right artificial shoulder joint: Secondary | ICD-10-CM | POA: Diagnosis not present

## 2021-08-29 DIAGNOSIS — Z23 Encounter for immunization: Secondary | ICD-10-CM | POA: Diagnosis not present

## 2021-08-29 DIAGNOSIS — Z96611 Presence of right artificial shoulder joint: Secondary | ICD-10-CM | POA: Diagnosis not present

## 2021-08-29 DIAGNOSIS — M25511 Pain in right shoulder: Secondary | ICD-10-CM | POA: Diagnosis not present

## 2021-08-29 DIAGNOSIS — Z4789 Encounter for other orthopedic aftercare: Secondary | ICD-10-CM | POA: Diagnosis not present

## 2021-09-02 DIAGNOSIS — Z96611 Presence of right artificial shoulder joint: Secondary | ICD-10-CM | POA: Diagnosis not present

## 2021-09-02 DIAGNOSIS — Z4789 Encounter for other orthopedic aftercare: Secondary | ICD-10-CM | POA: Diagnosis not present

## 2021-09-02 DIAGNOSIS — M25511 Pain in right shoulder: Secondary | ICD-10-CM | POA: Diagnosis not present

## 2021-09-04 DIAGNOSIS — M170 Bilateral primary osteoarthritis of knee: Secondary | ICD-10-CM | POA: Diagnosis not present

## 2021-09-05 DIAGNOSIS — M25511 Pain in right shoulder: Secondary | ICD-10-CM | POA: Diagnosis not present

## 2021-09-05 DIAGNOSIS — Z96611 Presence of right artificial shoulder joint: Secondary | ICD-10-CM | POA: Diagnosis not present

## 2021-09-05 DIAGNOSIS — Z4789 Encounter for other orthopedic aftercare: Secondary | ICD-10-CM | POA: Diagnosis not present

## 2021-09-09 DIAGNOSIS — Z96611 Presence of right artificial shoulder joint: Secondary | ICD-10-CM | POA: Diagnosis not present

## 2021-09-09 DIAGNOSIS — Z4789 Encounter for other orthopedic aftercare: Secondary | ICD-10-CM | POA: Diagnosis not present

## 2021-09-09 DIAGNOSIS — M25511 Pain in right shoulder: Secondary | ICD-10-CM | POA: Diagnosis not present

## 2021-09-12 DIAGNOSIS — Z4789 Encounter for other orthopedic aftercare: Secondary | ICD-10-CM | POA: Diagnosis not present

## 2021-09-12 DIAGNOSIS — M25511 Pain in right shoulder: Secondary | ICD-10-CM | POA: Diagnosis not present

## 2021-09-12 DIAGNOSIS — Z96611 Presence of right artificial shoulder joint: Secondary | ICD-10-CM | POA: Diagnosis not present

## 2021-09-16 DIAGNOSIS — Z4789 Encounter for other orthopedic aftercare: Secondary | ICD-10-CM | POA: Diagnosis not present

## 2021-09-16 DIAGNOSIS — M25511 Pain in right shoulder: Secondary | ICD-10-CM | POA: Diagnosis not present

## 2021-09-16 DIAGNOSIS — Z96611 Presence of right artificial shoulder joint: Secondary | ICD-10-CM | POA: Diagnosis not present

## 2021-09-19 DIAGNOSIS — Z4789 Encounter for other orthopedic aftercare: Secondary | ICD-10-CM | POA: Diagnosis not present

## 2021-09-19 DIAGNOSIS — M25511 Pain in right shoulder: Secondary | ICD-10-CM | POA: Diagnosis not present

## 2021-09-19 DIAGNOSIS — Z96611 Presence of right artificial shoulder joint: Secondary | ICD-10-CM | POA: Diagnosis not present

## 2021-09-20 DIAGNOSIS — M25531 Pain in right wrist: Secondary | ICD-10-CM | POA: Diagnosis not present

## 2021-09-23 DIAGNOSIS — Z4789 Encounter for other orthopedic aftercare: Secondary | ICD-10-CM | POA: Diagnosis not present

## 2021-09-23 DIAGNOSIS — Z96611 Presence of right artificial shoulder joint: Secondary | ICD-10-CM | POA: Diagnosis not present

## 2021-09-23 DIAGNOSIS — M25511 Pain in right shoulder: Secondary | ICD-10-CM | POA: Diagnosis not present

## 2021-09-24 DIAGNOSIS — M170 Bilateral primary osteoarthritis of knee: Secondary | ICD-10-CM | POA: Diagnosis not present

## 2021-09-30 DIAGNOSIS — Z4789 Encounter for other orthopedic aftercare: Secondary | ICD-10-CM | POA: Diagnosis not present

## 2021-09-30 DIAGNOSIS — M25511 Pain in right shoulder: Secondary | ICD-10-CM | POA: Diagnosis not present

## 2021-09-30 DIAGNOSIS — M25531 Pain in right wrist: Secondary | ICD-10-CM | POA: Diagnosis not present

## 2021-09-30 DIAGNOSIS — Z96611 Presence of right artificial shoulder joint: Secondary | ICD-10-CM | POA: Diagnosis not present

## 2021-09-30 DIAGNOSIS — G540 Brachial plexus disorders: Secondary | ICD-10-CM | POA: Diagnosis not present

## 2021-09-30 DIAGNOSIS — S42201A Unspecified fracture of upper end of right humerus, initial encounter for closed fracture: Secondary | ICD-10-CM | POA: Diagnosis not present

## 2021-10-01 DIAGNOSIS — M170 Bilateral primary osteoarthritis of knee: Secondary | ICD-10-CM | POA: Diagnosis not present

## 2021-10-03 DIAGNOSIS — M25511 Pain in right shoulder: Secondary | ICD-10-CM | POA: Diagnosis not present

## 2021-10-03 DIAGNOSIS — Z4789 Encounter for other orthopedic aftercare: Secondary | ICD-10-CM | POA: Diagnosis not present

## 2021-10-03 DIAGNOSIS — Z96611 Presence of right artificial shoulder joint: Secondary | ICD-10-CM | POA: Diagnosis not present

## 2021-10-07 DIAGNOSIS — Z4789 Encounter for other orthopedic aftercare: Secondary | ICD-10-CM | POA: Diagnosis not present

## 2021-10-07 DIAGNOSIS — M25511 Pain in right shoulder: Secondary | ICD-10-CM | POA: Diagnosis not present

## 2021-10-07 DIAGNOSIS — Z96611 Presence of right artificial shoulder joint: Secondary | ICD-10-CM | POA: Diagnosis not present

## 2021-10-10 DIAGNOSIS — M25511 Pain in right shoulder: Secondary | ICD-10-CM | POA: Diagnosis not present

## 2021-10-10 DIAGNOSIS — Z96611 Presence of right artificial shoulder joint: Secondary | ICD-10-CM | POA: Diagnosis not present

## 2021-10-10 DIAGNOSIS — Z4789 Encounter for other orthopedic aftercare: Secondary | ICD-10-CM | POA: Diagnosis not present

## 2021-10-14 DIAGNOSIS — M25511 Pain in right shoulder: Secondary | ICD-10-CM | POA: Diagnosis not present

## 2021-10-14 DIAGNOSIS — Z4789 Encounter for other orthopedic aftercare: Secondary | ICD-10-CM | POA: Diagnosis not present

## 2021-10-14 DIAGNOSIS — Z96611 Presence of right artificial shoulder joint: Secondary | ICD-10-CM | POA: Diagnosis not present

## 2021-10-24 DIAGNOSIS — Z96611 Presence of right artificial shoulder joint: Secondary | ICD-10-CM | POA: Diagnosis not present

## 2021-10-24 DIAGNOSIS — M25511 Pain in right shoulder: Secondary | ICD-10-CM | POA: Diagnosis not present

## 2021-10-24 DIAGNOSIS — Z4789 Encounter for other orthopedic aftercare: Secondary | ICD-10-CM | POA: Diagnosis not present

## 2021-10-31 ENCOUNTER — Inpatient Hospital Stay: Payer: Medicare PPO | Admitting: Oncology

## 2021-10-31 ENCOUNTER — Inpatient Hospital Stay: Payer: Medicare PPO

## 2021-11-08 ENCOUNTER — Inpatient Hospital Stay: Payer: Medicare PPO | Admitting: Oncology

## 2021-11-08 ENCOUNTER — Inpatient Hospital Stay: Payer: Medicare PPO | Attending: Oncology

## 2021-11-08 ENCOUNTER — Other Ambulatory Visit: Payer: Self-pay

## 2021-11-08 VITALS — BP 128/83 | HR 100 | Temp 97.8°F | Resp 20 | Ht 59.0 in | Wt 178.8 lb

## 2021-11-08 DIAGNOSIS — E049 Nontoxic goiter, unspecified: Secondary | ICD-10-CM | POA: Insufficient documentation

## 2021-11-08 DIAGNOSIS — I10 Essential (primary) hypertension: Secondary | ICD-10-CM | POA: Diagnosis not present

## 2021-11-08 DIAGNOSIS — C20 Malignant neoplasm of rectum: Secondary | ICD-10-CM

## 2021-11-08 DIAGNOSIS — Z87891 Personal history of nicotine dependence: Secondary | ICD-10-CM | POA: Diagnosis not present

## 2021-11-08 LAB — CEA (ACCESS): CEA (CHCC): 1.33 ng/mL (ref 0.00–5.00)

## 2021-11-08 NOTE — Progress Notes (Signed)
?  [facility] ?OFFICE PROGRESS NOTE ? ? ?Diagnosis: Colon cancer ? ?INTERVAL HISTORY:  ? ?Amber Morse returns as scheduled.  Good appetite.  No difficulty with bowel function.  She fell last fall and injured the right shoulder.  She underwent a right reverse shoulder arthroplasty 06/27/2021.  She continues to have a limited range of motion at the right shoulder. ? ? ?Objective: ? ?Vital signs in last 24 hours: ? ?Blood pressure 128/83, pulse 100, temperature 97.8 ?F (36.6 ?C), temperature source Oral, resp. rate 20, height $RemoveBe'4\' 11"'jjiwDFVBL$  (1.499 m), weight 178 lb 12.8 oz (81.1 kg), SpO2 100 %. ?  ? ?Lymphatics: No cervical, supraclavicular, axillary, or inguinal nodes ?Resp: Lungs clear bilaterally ?Cardio: Regular rate and rhythm ?GI: No hepatosplenomegaly, no mass, nontender ?Vascular: No leg edema  ? ? ?Lab Results: ? ?Lab Results  ?Component Value Date  ? WBC 5.4 06/27/2021  ? HGB 8.2 (L) 06/28/2021  ? HCT 25.3 (L) 06/28/2021  ? MCV 94.4 06/27/2021  ? PLT 296 06/27/2021  ? ? ?CMP  ?Lab Results  ?Component Value Date  ? NA 137 06/27/2021  ? K 3.9 06/27/2021  ? CL 105 06/27/2021  ? CO2 22 06/27/2021  ? GLUCOSE 98 06/27/2021  ? BUN 17 06/27/2021  ? CREATININE 0.99 06/27/2021  ? CALCIUM 9.8 06/27/2021  ? GFRNONAA 60 (L) 06/27/2021  ? ? ?Lab Results  ?Component Value Date  ? CEA1 1.08 05/09/2021  ? CEA 1.33 11/08/2021  ? ? ? ?Medications: I have reviewed the patient's current medications. ? ? 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 ?Laparoscopic assisted polypectomy 03/24/2017- superficial fragments of a tubular adenoma with high-grade dysplasia, biopsy of the polyp base-dysplastic glandular epithelium involving fibrotic stroma ?Recurrent diarrhea 2019 ?Sigmoidoscopy-invasive moderately differentiated adenocarcinoma at the previous polyp site ?CTs chest, abdomen, pelvis on 12/04/2017-no evidence of metastatic disease, left thyroid goiter,  small subpleural node ?MRI pelvis 12/04/2017, T2 (equivocal T3), N0 high rectal mass above the peritoneal reflection, measured at 12 cm from the anal verge ?Laparoscopic anterior resection 12/10/2017, stage II (pT3,pN0 close (moderately differentiated adenocarcinoma of the rectum, early T3, no lymphovascular or perineural invasion, no loss of mismatch repair protein expression ?CEA on 01/07/2018 - 0.7 ?CEA on 03/08/2020-less than 1 ?Hypertension ?  ?3.   History of tobacco use ?  ?4.   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  ?5.   Diarrhea beginning September 2020 following a weight loss diet, followed by gastroenterology ?  ? ? ?Disposition: ?Ms. Seier remains in clinical remission from colorectal cancer.  The CEA is normal.  She will return for an office visit and CEA in 6 months. ?She plans to schedule a surveillance colonoscopy with Dr. Earlean Shawl this summer.  She last had a colonoscopy in June 2020. ? ?Betsy Coder, MD ? ?11/08/2021  ?12:35 PM ? ? ?

## 2021-11-14 DIAGNOSIS — Z96611 Presence of right artificial shoulder joint: Secondary | ICD-10-CM | POA: Diagnosis not present

## 2021-11-14 DIAGNOSIS — M79641 Pain in right hand: Secondary | ICD-10-CM | POA: Diagnosis not present

## 2021-11-22 DIAGNOSIS — I10 Essential (primary) hypertension: Secondary | ICD-10-CM | POA: Diagnosis not present

## 2021-11-22 DIAGNOSIS — E782 Mixed hyperlipidemia: Secondary | ICD-10-CM | POA: Diagnosis not present

## 2021-11-22 DIAGNOSIS — E559 Vitamin D deficiency, unspecified: Secondary | ICD-10-CM | POA: Diagnosis not present

## 2021-11-28 DIAGNOSIS — G4700 Insomnia, unspecified: Secondary | ICD-10-CM | POA: Diagnosis not present

## 2021-11-28 DIAGNOSIS — K219 Gastro-esophageal reflux disease without esophagitis: Secondary | ICD-10-CM | POA: Diagnosis not present

## 2021-11-28 DIAGNOSIS — I10 Essential (primary) hypertension: Secondary | ICD-10-CM | POA: Diagnosis not present

## 2021-11-28 DIAGNOSIS — E559 Vitamin D deficiency, unspecified: Secondary | ICD-10-CM | POA: Diagnosis not present

## 2021-11-28 DIAGNOSIS — E782 Mixed hyperlipidemia: Secondary | ICD-10-CM | POA: Diagnosis not present

## 2022-01-16 DIAGNOSIS — Z96611 Presence of right artificial shoulder joint: Secondary | ICD-10-CM | POA: Diagnosis not present

## 2022-01-19 IMAGING — DX DG SHOULDER 2+V PORT*R*
1 series · 1 of 1 positions shown · non-contrast
Comparison: Preoperative right shoulder series 06/20/2021

CLINICAL DATA: Postoperative following right shoulder arthroplasty

EXAM:
PORTABLE RIGHT SHOULDER

[shoulder]
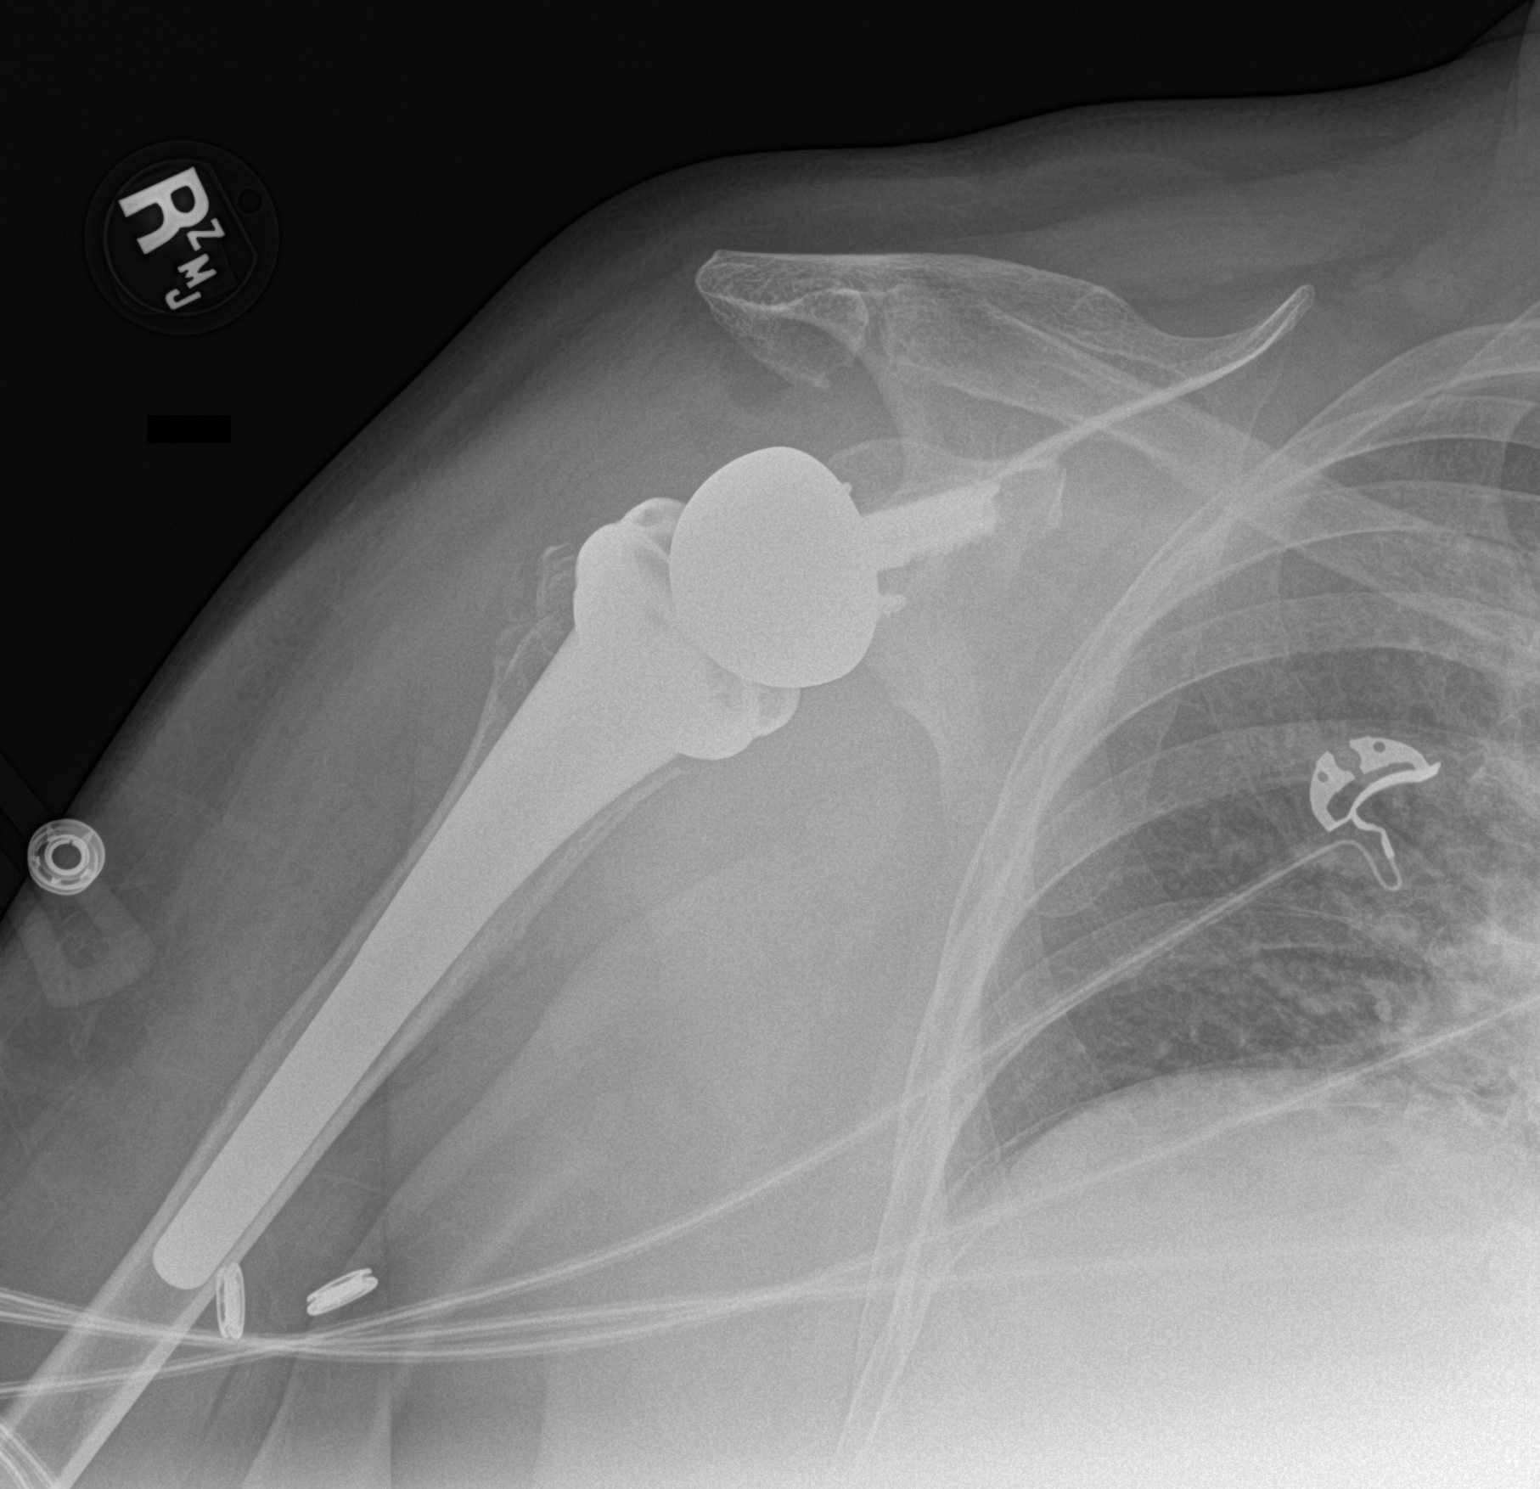

[1 of 1 positions shown; findings below may reference images not displayed]

FINDINGS: There is interval resection of the right humeral head and proximal
neck and interval placement of a right shoulder reverse
arthroplasty. Only one view was submitted. The hardware appears
normally positioned on this single view. There is no evidence of
periprosthetic fracture. The bone mineralization is osteopenic. The
AC joint is intact with spurring changes.

The visualized portion of the right lung is clear. Surrounding soft
tissues show scattered air inclusions.
IMPRESSION: Reverse right shoulder arthroplasty. No acute hardware complication
is seen. Osteopenia.

## 2022-02-11 DIAGNOSIS — E669 Obesity, unspecified: Secondary | ICD-10-CM | POA: Diagnosis not present

## 2022-02-11 DIAGNOSIS — R739 Hyperglycemia, unspecified: Secondary | ICD-10-CM | POA: Diagnosis not present

## 2022-02-13 DIAGNOSIS — G4700 Insomnia, unspecified: Secondary | ICD-10-CM | POA: Diagnosis not present

## 2022-02-13 DIAGNOSIS — F411 Generalized anxiety disorder: Secondary | ICD-10-CM | POA: Diagnosis not present

## 2022-02-13 DIAGNOSIS — F331 Major depressive disorder, recurrent, moderate: Secondary | ICD-10-CM | POA: Diagnosis not present

## 2022-03-21 DIAGNOSIS — I10 Essential (primary) hypertension: Secondary | ICD-10-CM | POA: Diagnosis not present

## 2022-03-21 DIAGNOSIS — E782 Mixed hyperlipidemia: Secondary | ICD-10-CM | POA: Diagnosis not present

## 2022-03-27 DIAGNOSIS — G4700 Insomnia, unspecified: Secondary | ICD-10-CM | POA: Diagnosis not present

## 2022-03-27 DIAGNOSIS — E559 Vitamin D deficiency, unspecified: Secondary | ICD-10-CM | POA: Diagnosis not present

## 2022-03-27 DIAGNOSIS — K529 Noninfective gastroenteritis and colitis, unspecified: Secondary | ICD-10-CM | POA: Diagnosis not present

## 2022-03-27 DIAGNOSIS — F411 Generalized anxiety disorder: Secondary | ICD-10-CM | POA: Diagnosis not present

## 2022-03-27 DIAGNOSIS — I10 Essential (primary) hypertension: Secondary | ICD-10-CM | POA: Diagnosis not present

## 2022-03-27 DIAGNOSIS — E782 Mixed hyperlipidemia: Secondary | ICD-10-CM | POA: Diagnosis not present

## 2022-03-27 DIAGNOSIS — F331 Major depressive disorder, recurrent, moderate: Secondary | ICD-10-CM | POA: Diagnosis not present

## 2022-04-02 DIAGNOSIS — L820 Inflamed seborrheic keratosis: Secondary | ICD-10-CM | POA: Diagnosis not present

## 2022-04-02 DIAGNOSIS — C44311 Basal cell carcinoma of skin of nose: Secondary | ICD-10-CM | POA: Diagnosis not present

## 2022-04-10 DIAGNOSIS — I10 Essential (primary) hypertension: Secondary | ICD-10-CM | POA: Diagnosis not present

## 2022-04-10 DIAGNOSIS — M170 Bilateral primary osteoarthritis of knee: Secondary | ICD-10-CM | POA: Diagnosis not present

## 2022-04-10 DIAGNOSIS — Z23 Encounter for immunization: Secondary | ICD-10-CM | POA: Diagnosis not present

## 2022-04-10 DIAGNOSIS — K529 Noninfective gastroenteritis and colitis, unspecified: Secondary | ICD-10-CM | POA: Diagnosis not present

## 2022-04-30 DIAGNOSIS — Z85828 Personal history of other malignant neoplasm of skin: Secondary | ICD-10-CM | POA: Diagnosis not present

## 2022-04-30 DIAGNOSIS — L821 Other seborrheic keratosis: Secondary | ICD-10-CM | POA: Diagnosis not present

## 2022-04-30 DIAGNOSIS — Z08 Encounter for follow-up examination after completed treatment for malignant neoplasm: Secondary | ICD-10-CM | POA: Diagnosis not present

## 2022-05-01 DIAGNOSIS — I10 Essential (primary) hypertension: Secondary | ICD-10-CM | POA: Diagnosis not present

## 2022-05-01 DIAGNOSIS — M170 Bilateral primary osteoarthritis of knee: Secondary | ICD-10-CM | POA: Diagnosis not present

## 2022-05-01 DIAGNOSIS — M19041 Primary osteoarthritis, right hand: Secondary | ICD-10-CM | POA: Diagnosis not present

## 2022-05-06 DIAGNOSIS — Z96611 Presence of right artificial shoulder joint: Secondary | ICD-10-CM | POA: Diagnosis not present

## 2022-05-08 DIAGNOSIS — M170 Bilateral primary osteoarthritis of knee: Secondary | ICD-10-CM | POA: Diagnosis not present

## 2022-05-15 DIAGNOSIS — F411 Generalized anxiety disorder: Secondary | ICD-10-CM | POA: Diagnosis not present

## 2022-05-15 DIAGNOSIS — M170 Bilateral primary osteoarthritis of knee: Secondary | ICD-10-CM | POA: Diagnosis not present

## 2022-05-15 DIAGNOSIS — G4700 Insomnia, unspecified: Secondary | ICD-10-CM | POA: Diagnosis not present

## 2022-05-15 DIAGNOSIS — Z23 Encounter for immunization: Secondary | ICD-10-CM | POA: Diagnosis not present

## 2022-05-15 DIAGNOSIS — F331 Major depressive disorder, recurrent, moderate: Secondary | ICD-10-CM | POA: Diagnosis not present

## 2022-05-15 DIAGNOSIS — I10 Essential (primary) hypertension: Secondary | ICD-10-CM | POA: Diagnosis not present

## 2022-05-16 ENCOUNTER — Inpatient Hospital Stay: Payer: Medicare PPO | Admitting: Oncology

## 2022-05-16 ENCOUNTER — Telehealth: Payer: Self-pay

## 2022-05-16 ENCOUNTER — Inpatient Hospital Stay: Payer: Medicare PPO | Attending: Oncology

## 2022-05-16 VITALS — BP 125/75 | HR 68 | Temp 98.3°F | Resp 20 | Ht 59.0 in | Wt 166.0 lb

## 2022-05-16 DIAGNOSIS — M179 Osteoarthritis of knee, unspecified: Secondary | ICD-10-CM | POA: Insufficient documentation

## 2022-05-16 DIAGNOSIS — C20 Malignant neoplasm of rectum: Secondary | ICD-10-CM

## 2022-05-16 DIAGNOSIS — E042 Nontoxic multinodular goiter: Secondary | ICD-10-CM | POA: Diagnosis not present

## 2022-05-16 DIAGNOSIS — Z85048 Personal history of other malignant neoplasm of rectum, rectosigmoid junction, and anus: Secondary | ICD-10-CM | POA: Diagnosis not present

## 2022-05-16 DIAGNOSIS — K5909 Other constipation: Secondary | ICD-10-CM | POA: Diagnosis not present

## 2022-05-16 DIAGNOSIS — I10 Essential (primary) hypertension: Secondary | ICD-10-CM | POA: Insufficient documentation

## 2022-05-16 DIAGNOSIS — Z87891 Personal history of nicotine dependence: Secondary | ICD-10-CM | POA: Insufficient documentation

## 2022-05-16 LAB — CEA (ACCESS): CEA (CHCC): 1.21 ng/mL (ref 0.00–5.00)

## 2022-05-16 NOTE — Telephone Encounter (Signed)
Patient gave verbal understanding and had no further questions or concerns  

## 2022-05-16 NOTE — Telephone Encounter (Signed)
-----   Message from Ladell Pier, MD sent at 05/16/2022  1:54 PM EDT ----- Please call patient, CEA is normal, follow-up as scheduled

## 2022-05-16 NOTE — Progress Notes (Signed)
  [facility] OFFICE PROGRESS NOTE   Diagnosis: Colon cancer  INTERVAL HISTORY:   Amber Morse returns as scheduled.  She feels well.  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  She is followed by orthopedics.  She has chronic constipation.  No bleeding.  She is scheduled for colonoscopy in November.  She had a fall and injured the right arm.  She underwent shoulder replacement surgery in November 2022. She reports intentional weight loss. Objective:  Vital signs in last 24 hours:  Blood pressure 125/75, pulse 68, temperature 98.3 F (36.8 C), temperature source Oral, resp. rate 20, height $RemoveBe'4\' 11"'yEssMdllA$  (1.499 m), SpO2 100 %.    Lymphatics: No cervical, supraclavicular, axillary, or inguinal nodes Resp: Lungs clear bilaterally Cardio: Regular rate and rhythm GI: No hepatosplenomegaly, no mass, nontender Vascular: No leg edema   Lab Results:  Lab Results  Component Value Date   WBC 5.4 06/27/2021   HGB 8.2 (L) 06/28/2021   HCT 25.3 (L) 06/28/2021   MCV 94.4 06/27/2021   PLT 296 06/27/2021    CMP  Lab Results  Component Value Date   NA 137 06/27/2021   K 3.9 06/27/2021   CL 105 06/27/2021   CO2 22 06/27/2021   GLUCOSE 98 06/27/2021   BUN 17 06/27/2021   CREATININE 0.99 06/27/2021   CALCIUM 9.8 06/27/2021   GFRNONAA 60 (L) 06/27/2021    Lab Results  Component Value Date   CEA1 1.08 05/09/2021   CEA 1.33 11/08/2021     Medications: I have reviewed the patient's current medications.   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 Laparoscopic assisted polypectomy 03/24/2017- superficial fragments of a tubular adenoma with high-grade dysplasia, biopsy of the polyp base-dysplastic glandular epithelium involving fibrotic stroma Recurrent diarrhea 2019 Sigmoidoscopy-invasive moderately differentiated adenocarcinoma at the previous polyp site CTs chest, abdomen, pelvis on 12/04/2017-no evidence  of metastatic disease, left thyroid goiter, small subpleural node MRI pelvis 12/04/2017, T2 (equivocal T3), N0 high rectal mass above the peritoneal reflection, measured at 12 cm from the anal verge Laparoscopic anterior resection 12/10/2017, stage II (pT3,pN0 close (moderately differentiated adenocarcinoma of the rectum, early T3, no lymphovascular or perineural invasion, no loss of mismatch repair protein expression CEA on 01/07/2018 - 0.7 Negative colonoscopy 02/21/2019 CEA on 03/08/2020-less than 1 Hypertension   3.   History of tobacco use   4.   Left thyroid "goiter "noted on CT 12/04/2017   5.   Diarrhea beginning September 2020 following a weight loss diet, followed by gastroenterology      Disposition: Amber Morse is in clinical remission from colorectal cancer.  We will follow-up on the CEA from today.  She will return for an office visit in 6 months.  She is scheduled for a surveillance colonoscopy in November.  Betsy Coder, MD  05/16/2022  12:32 PM

## 2022-07-07 DIAGNOSIS — K6389 Other specified diseases of intestine: Secondary | ICD-10-CM | POA: Diagnosis not present

## 2022-07-07 DIAGNOSIS — K648 Other hemorrhoids: Secondary | ICD-10-CM | POA: Diagnosis not present

## 2022-07-07 DIAGNOSIS — K52831 Collagenous colitis: Secondary | ICD-10-CM | POA: Diagnosis not present

## 2022-07-07 DIAGNOSIS — C20 Malignant neoplasm of rectum: Secondary | ICD-10-CM | POA: Diagnosis not present

## 2022-07-07 DIAGNOSIS — Z85048 Personal history of other malignant neoplasm of rectum, rectosigmoid junction, and anus: Secondary | ICD-10-CM | POA: Diagnosis not present

## 2022-07-07 DIAGNOSIS — R197 Diarrhea, unspecified: Secondary | ICD-10-CM | POA: Diagnosis not present

## 2022-07-07 DIAGNOSIS — Z980 Intestinal bypass and anastomosis status: Secondary | ICD-10-CM | POA: Diagnosis not present

## 2022-07-07 DIAGNOSIS — Z9049 Acquired absence of other specified parts of digestive tract: Secondary | ICD-10-CM | POA: Diagnosis not present

## 2022-07-07 DIAGNOSIS — K621 Rectal polyp: Secondary | ICD-10-CM | POA: Diagnosis not present

## 2022-07-07 DIAGNOSIS — K635 Polyp of colon: Secondary | ICD-10-CM | POA: Diagnosis not present

## 2022-07-07 DIAGNOSIS — K5900 Constipation, unspecified: Secondary | ICD-10-CM | POA: Diagnosis not present

## 2022-07-07 DIAGNOSIS — K529 Noninfective gastroenteritis and colitis, unspecified: Secondary | ICD-10-CM | POA: Diagnosis not present

## 2022-07-07 DIAGNOSIS — K5730 Diverticulosis of large intestine without perforation or abscess without bleeding: Secondary | ICD-10-CM | POA: Diagnosis not present

## 2022-09-26 DIAGNOSIS — E782 Mixed hyperlipidemia: Secondary | ICD-10-CM | POA: Diagnosis not present

## 2022-09-26 DIAGNOSIS — I10 Essential (primary) hypertension: Secondary | ICD-10-CM | POA: Diagnosis not present

## 2022-09-26 DIAGNOSIS — E559 Vitamin D deficiency, unspecified: Secondary | ICD-10-CM | POA: Diagnosis not present

## 2022-10-07 DIAGNOSIS — E782 Mixed hyperlipidemia: Secondary | ICD-10-CM | POA: Diagnosis not present

## 2022-10-07 DIAGNOSIS — I10 Essential (primary) hypertension: Secondary | ICD-10-CM | POA: Diagnosis not present

## 2022-10-07 DIAGNOSIS — F411 Generalized anxiety disorder: Secondary | ICD-10-CM | POA: Diagnosis not present

## 2022-10-07 DIAGNOSIS — E559 Vitamin D deficiency, unspecified: Secondary | ICD-10-CM | POA: Diagnosis not present

## 2022-10-07 DIAGNOSIS — G4700 Insomnia, unspecified: Secondary | ICD-10-CM | POA: Diagnosis not present

## 2022-11-03 DIAGNOSIS — Z08 Encounter for follow-up examination after completed treatment for malignant neoplasm: Secondary | ICD-10-CM | POA: Diagnosis not present

## 2022-11-03 DIAGNOSIS — I820 Budd-Chiari syndrome: Secondary | ICD-10-CM | POA: Diagnosis not present

## 2022-11-03 DIAGNOSIS — L820 Inflamed seborrheic keratosis: Secondary | ICD-10-CM | POA: Diagnosis not present

## 2022-11-03 DIAGNOSIS — Z85828 Personal history of other malignant neoplasm of skin: Secondary | ICD-10-CM | POA: Diagnosis not present

## 2022-11-06 DIAGNOSIS — H2513 Age-related nuclear cataract, bilateral: Secondary | ICD-10-CM | POA: Diagnosis not present

## 2022-11-06 DIAGNOSIS — H02403 Unspecified ptosis of bilateral eyelids: Secondary | ICD-10-CM | POA: Diagnosis not present

## 2022-11-06 DIAGNOSIS — H18413 Arcus senilis, bilateral: Secondary | ICD-10-CM | POA: Diagnosis not present

## 2022-11-06 DIAGNOSIS — H25043 Posterior subcapsular polar age-related cataract, bilateral: Secondary | ICD-10-CM | POA: Diagnosis not present

## 2022-11-07 ENCOUNTER — Inpatient Hospital Stay: Payer: Medicare PPO

## 2022-11-07 ENCOUNTER — Inpatient Hospital Stay: Payer: Medicare PPO | Admitting: Oncology

## 2022-11-28 DIAGNOSIS — Z1339 Encounter for screening examination for other mental health and behavioral disorders: Secondary | ICD-10-CM | POA: Diagnosis not present

## 2022-11-28 DIAGNOSIS — Z0000 Encounter for general adult medical examination without abnormal findings: Secondary | ICD-10-CM | POA: Diagnosis not present

## 2022-11-28 DIAGNOSIS — Z23 Encounter for immunization: Secondary | ICD-10-CM | POA: Diagnosis not present

## 2022-11-28 DIAGNOSIS — Z1331 Encounter for screening for depression: Secondary | ICD-10-CM | POA: Diagnosis not present

## 2022-12-04 ENCOUNTER — Inpatient Hospital Stay: Payer: Medicare PPO

## 2022-12-04 ENCOUNTER — Inpatient Hospital Stay: Payer: Medicare PPO | Admitting: Oncology

## 2022-12-12 ENCOUNTER — Inpatient Hospital Stay: Payer: Medicare PPO | Admitting: Oncology

## 2022-12-12 ENCOUNTER — Inpatient Hospital Stay: Payer: Medicare PPO | Attending: Oncology

## 2022-12-12 VITALS — BP 138/80 | HR 88 | Temp 98.1°F | Resp 18 | Ht 59.0 in | Wt 172.0 lb

## 2022-12-12 DIAGNOSIS — Z87891 Personal history of nicotine dependence: Secondary | ICD-10-CM | POA: Diagnosis not present

## 2022-12-12 DIAGNOSIS — C20 Malignant neoplasm of rectum: Secondary | ICD-10-CM

## 2022-12-12 DIAGNOSIS — I10 Essential (primary) hypertension: Secondary | ICD-10-CM | POA: Diagnosis not present

## 2022-12-12 DIAGNOSIS — E049 Nontoxic goiter, unspecified: Secondary | ICD-10-CM | POA: Diagnosis not present

## 2022-12-12 DIAGNOSIS — Z85048 Personal history of other malignant neoplasm of rectum, rectosigmoid junction, and anus: Secondary | ICD-10-CM | POA: Diagnosis not present

## 2022-12-12 LAB — CEA (ACCESS): CEA (CHCC): 1.16 ng/mL (ref 0.00–5.00)

## 2022-12-12 NOTE — Progress Notes (Signed)
  [facility] OFFICE PROGRESS NOTE   Diagnosis: Rectal cancer  INTERVAL HISTORY:   Amber Morse returns as scheduled.  She feels well.  No difficulty with bowel function.  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  No other complaint. She underwent a colonoscopy with Dr. Jola Babinski on 07/07/2022.  The colonic mucosa appeared normal.  The colorectal anastomosis had 2 less than 5 mm flat polyps.  The pathology from the anastomotic polyp returned as a hyperplastic polyp.    Objective:  Vital signs in last 24 hours:  Blood pressure 138/80, pulse 88, temperature 98.1 F (36.7 C), temperature source Oral, resp. rate 18, height  (1.499 m), weight 172 lb (78 kg), SpO2 99 %.   Lymphatics: No cervical, supraclavicular, axillary, or inguinal nodes Resp: Lungs clear bilaterally Cardio: Regular rate and rhythm GI: No hepatosplenomegaly, no mass, nontender Vascular: No leg edema   Lab Results:  Lab Results  Component Value Date   WBC 5.4 06/27/2021   HGB 8.2 (L) 06/28/2021   HCT 25.3 (L) 06/28/2021   MCV 94.4 06/27/2021   PLT 296 06/27/2021    CMP  Lab Results  Component Value Date   NA 137 06/27/2021   K 3.9 06/27/2021   CL 105 06/27/2021   CO2 22 06/27/2021   GLUCOSE 98 06/27/2021   BUN 17 06/27/2021   CREATININE 0.99 06/27/2021   CALCIUM 9.8 06/27/2021   GFRNONAA 60 (L) 06/27/2021    Lab Results  Component Value Date   CEA1 1.08 05/09/2021   CEA 1.16 12/12/2022     Medications: I have reviewed the patient's current medications.   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 12/30/2016 Laparoscopic assisted polypectomy 03/24/2017- superficial fragments of a tubular adenoma with high-grade dysplasia, biopsy of the polyp base-dysplastic glandular epithelium involving fibrotic stroma Recurrent diarrhea 2019 Sigmoidoscopy-invasive moderately differentiated adenocarcinoma at the previous polyp  site CTs chest, abdomen, pelvis on 12/04/2017-no evidence of metastatic disease, left thyroid goiter, small subpleural node MRI pelvis 12/04/2017, T2 (equivocal T3), N0 high rectal mass above the peritoneal reflection, measured at 12 cm from the anal verge Laparoscopic anterior resection 12/10/2017, stage II (pT3,pN0 close (moderately differentiated adenocarcinoma of the rectum, early T3, no lymphovascular or perineural invasion, no loss of mismatch repair protein expression CEA on 01/07/2018 - 0.7 Negative colonoscopy 02/21/2019 CEA on 03/08/2020-less than 1 Colonoscopy 07/07/2022-normal colonic mucosa, 2 small polyps at the colorectal anastomosis, 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   History of tobacco use   4.   Left thyroid "goiter "noted on CT 12/04/2017   5.   Diarrhea beginning September 2020 following a weight loss diet, followed by gastroenterology      Disposition: Amber Morse is in clinical remission from colorectal cancer.  She is now 5 years out from the laparoscopic resection of the early stage high rectal tumor.  She has a good prognosis for a long-term disease-free survival.   She will continue colonoscopy surveillance with Dr. Jola Babinski.  Amber Morse plans to continue clinical follow-up with Dr. Duanne Guess.  She is not scheduled for a follow-up appointment at the Cancer center.  I am available to see her as needed.  Thornton Papas, MD  12/12/2022  10:30 AM

## 2023-01-15 DIAGNOSIS — H25043 Posterior subcapsular polar age-related cataract, bilateral: Secondary | ICD-10-CM | POA: Diagnosis not present

## 2023-01-15 DIAGNOSIS — H2512 Age-related nuclear cataract, left eye: Secondary | ICD-10-CM | POA: Diagnosis not present

## 2023-01-15 DIAGNOSIS — H18413 Arcus senilis, bilateral: Secondary | ICD-10-CM | POA: Diagnosis not present

## 2023-01-15 DIAGNOSIS — H25013 Cortical age-related cataract, bilateral: Secondary | ICD-10-CM | POA: Diagnosis not present

## 2023-01-15 DIAGNOSIS — H2513 Age-related nuclear cataract, bilateral: Secondary | ICD-10-CM | POA: Diagnosis not present

## 2023-04-13 DIAGNOSIS — H2512 Age-related nuclear cataract, left eye: Secondary | ICD-10-CM | POA: Diagnosis not present

## 2023-04-14 DIAGNOSIS — H2511 Age-related nuclear cataract, right eye: Secondary | ICD-10-CM | POA: Diagnosis not present

## 2023-05-11 DIAGNOSIS — H2511 Age-related nuclear cataract, right eye: Secondary | ICD-10-CM | POA: Diagnosis not present

## 2023-05-11 DIAGNOSIS — H25041 Posterior subcapsular polar age-related cataract, right eye: Secondary | ICD-10-CM | POA: Diagnosis not present

## 2023-05-11 DIAGNOSIS — H25091 Other age-related incipient cataract, right eye: Secondary | ICD-10-CM | POA: Diagnosis not present

## 2023-05-11 DIAGNOSIS — H25011 Cortical age-related cataract, right eye: Secondary | ICD-10-CM | POA: Diagnosis not present

## 2023-08-05 DIAGNOSIS — D0471 Carcinoma in situ of skin of right lower limb, including hip: Secondary | ICD-10-CM | POA: Diagnosis not present

## 2023-12-01 DIAGNOSIS — Z1331 Encounter for screening for depression: Secondary | ICD-10-CM | POA: Diagnosis not present

## 2023-12-01 DIAGNOSIS — K529 Noninfective gastroenteritis and colitis, unspecified: Secondary | ICD-10-CM | POA: Diagnosis not present

## 2023-12-01 DIAGNOSIS — Z1339 Encounter for screening examination for other mental health and behavioral disorders: Secondary | ICD-10-CM | POA: Diagnosis not present

## 2023-12-01 DIAGNOSIS — Z0000 Encounter for general adult medical examination without abnormal findings: Secondary | ICD-10-CM | POA: Diagnosis not present

## 2023-12-01 DIAGNOSIS — I10 Essential (primary) hypertension: Secondary | ICD-10-CM | POA: Diagnosis not present

## 2023-12-01 DIAGNOSIS — E559 Vitamin D deficiency, unspecified: Secondary | ICD-10-CM | POA: Diagnosis not present

## 2023-12-01 DIAGNOSIS — C20 Malignant neoplasm of rectum: Secondary | ICD-10-CM | POA: Diagnosis not present

## 2023-12-01 DIAGNOSIS — E785 Hyperlipidemia, unspecified: Secondary | ICD-10-CM | POA: Diagnosis not present

## 2023-12-01 DIAGNOSIS — R5383 Other fatigue: Secondary | ICD-10-CM | POA: Diagnosis not present

## 2023-12-10 DIAGNOSIS — E782 Mixed hyperlipidemia: Secondary | ICD-10-CM | POA: Diagnosis not present

## 2023-12-10 DIAGNOSIS — I10 Essential (primary) hypertension: Secondary | ICD-10-CM | POA: Diagnosis not present

## 2023-12-10 DIAGNOSIS — E559 Vitamin D deficiency, unspecified: Secondary | ICD-10-CM | POA: Diagnosis not present

## 2023-12-10 DIAGNOSIS — Z789 Other specified health status: Secondary | ICD-10-CM | POA: Diagnosis not present

## 2024-04-27 DIAGNOSIS — Z13220 Encounter for screening for lipoid disorders: Secondary | ICD-10-CM | POA: Diagnosis not present

## 2024-05-05 DIAGNOSIS — Z23 Encounter for immunization: Secondary | ICD-10-CM | POA: Diagnosis not present

## 2024-05-05 DIAGNOSIS — Z7185 Encounter for immunization safety counseling: Secondary | ICD-10-CM | POA: Diagnosis not present

## 2024-05-05 DIAGNOSIS — C189 Malignant neoplasm of colon, unspecified: Secondary | ICD-10-CM | POA: Diagnosis not present

## 2024-05-05 DIAGNOSIS — Z0000 Encounter for general adult medical examination without abnormal findings: Secondary | ICD-10-CM | POA: Diagnosis not present

## 2024-08-12 ENCOUNTER — Encounter: Payer: Self-pay | Admitting: *Deleted

## 2024-08-12 NOTE — Progress Notes (Signed)
 Amber Morse                                          MRN: [id number]   08/12/2024   The [facility] Quality Team Specialist reviewed this patient medical record for the purposes of chart review for care gap closure. The following were reviewed: chart review for care gap closure-controlling blood pressure.    [facility] Quality Team
